# Patient Record
Sex: Female | Born: 1990 | Race: Black or African American | Hispanic: No | Marital: Single | Smoking: Never smoker
Health system: Southern US, Community
[De-identification: ages and names within clinical notes are randomized; demographics above are authoritative.]

## PROBLEM LIST (undated history)

## (undated) ENCOUNTER — Inpatient Hospital Stay (HOSPITAL_COMMUNITY): Payer: Self-pay

## (undated) DIAGNOSIS — R87629 Unspecified abnormal cytological findings in specimens from vagina: Secondary | ICD-10-CM

## (undated) DIAGNOSIS — D649 Anemia, unspecified: Secondary | ICD-10-CM

## (undated) DIAGNOSIS — F329 Major depressive disorder, single episode, unspecified: Secondary | ICD-10-CM

## (undated) DIAGNOSIS — G43909 Migraine, unspecified, not intractable, without status migrainosus: Secondary | ICD-10-CM

## (undated) DIAGNOSIS — F419 Anxiety disorder, unspecified: Secondary | ICD-10-CM

## (undated) DIAGNOSIS — B009 Herpesviral infection, unspecified: Secondary | ICD-10-CM

## (undated) DIAGNOSIS — F32A Depression, unspecified: Secondary | ICD-10-CM

## (undated) DIAGNOSIS — J45909 Unspecified asthma, uncomplicated: Secondary | ICD-10-CM

## (undated) HISTORY — PX: WISDOM TOOTH EXTRACTION: SHX21

## (undated) HISTORY — PX: COLPOSCOPY: SHX161

---
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
## 2010-02-17 ENCOUNTER — Inpatient Hospital Stay (HOSPITAL_COMMUNITY)
Admission: AD | Admit: 2010-02-17 | Discharge: 2010-02-17 | Payer: Self-pay | Source: Home / Self Care | Attending: Obstetrics & Gynecology | Admitting: Obstetrics & Gynecology

## 2010-04-27 LAB — URINE MICROSCOPIC-ADD ON

## 2010-04-27 LAB — URINALYSIS, ROUTINE W REFLEX MICROSCOPIC
Bilirubin Urine: NEGATIVE
Glucose, UA: NEGATIVE mg/dL
Ketones, ur: >80 mg/dL — AB
Leukocytes, UA: NEGATIVE
Nitrite: NEGATIVE
Protein, ur: NEGATIVE mg/dL
Specific Gravity, Urine: >1.03 — ABNORMAL HIGH (ref 1.005–1.030)
Urobilinogen, UA: 0.2 mg/dL (ref 0.0–1.0)
pH: 5.5 (ref 5.0–8.0)

## 2010-04-27 LAB — POCT PREGNANCY, URINE: Preg Test, Ur: NEGATIVE

## 2010-05-24 LAB — COMPREHENSIVE METABOLIC PANEL
ALT: 12 U/L (ref 0–35)
AST: 21 U/L (ref 0–37)
Albumin: 3.6 g/dL (ref 3.5–5.2)
Alkaline Phosphatase: 54 U/L (ref 39–117)
BUN: 13 mg/dL (ref 6–23)
CO2: 22 mEq/L (ref 19–32)
Calcium: 9.2 mg/dL (ref 8.4–10.5)
Chloride: 109 mEq/L (ref 96–112)
Creatinine, Ser: 0.6 mg/dL (ref 0.4–1.2)
GFR calc Af Amer: >60 mL/min (ref >60)
GFR calc non Af Amer: >60 mL/min (ref >60)
Glucose, Bld: 96 mg/dL (ref 70–99)
Potassium: 4.3 mEq/L (ref 3.5–5.1)
Sodium: 138 mEq/L (ref 135–145)
Total Bilirubin: 0.5 mg/dL (ref 0.3–1.2)
Total Protein: 7.1 g/dL (ref 6.0–8.3)

## 2010-05-24 LAB — CBC
HCT: 36.9 % (ref 36.0–46.0)
Hemoglobin: 12.3 g/dL (ref 12.0–15.0)
MCHC: 33.3 g/dL (ref 30.0–36.0)
MCV: 88.7 fL (ref 78.0–100.0)
Platelets: 264 10*3/uL (ref 150–400)
RBC: 4.16 MIL/uL (ref 3.87–5.11)
RDW: 14.7 % (ref 11.5–15.5)
WBC: 11.6 10*3/uL — ABNORMAL HIGH (ref 4.0–10.5)

## 2010-05-24 LAB — URINALYSIS, ROUTINE W REFLEX MICROSCOPIC
Bilirubin Urine: NEGATIVE
Glucose, UA: NEGATIVE mg/dL
Ketones, ur: NEGATIVE mg/dL
Leukocytes, UA: NEGATIVE
Nitrite: NEGATIVE
Protein, ur: NEGATIVE mg/dL
Specific Gravity, Urine: 1.027 (ref 1.005–1.030)
Urobilinogen, UA: 1 mg/dL (ref 0.0–1.0)
pH: 7.5 (ref 5.0–8.0)

## 2010-05-24 LAB — DIFFERENTIAL
Basophils Absolute: 0 10*3/uL (ref 0.0–0.1)
Basophils Relative: 0 % (ref 0–1)
Eosinophils Absolute: 0.7 10*3/uL (ref 0.0–0.7)
Eosinophils Relative: 6 % — ABNORMAL HIGH (ref 0–5)
Lymphocytes Relative: 35 % (ref 12–46)
Lymphs Abs: 4.1 10*3/uL — ABNORMAL HIGH (ref 0.7–4.0)
Monocytes Absolute: 1 10*3/uL (ref 0.1–1.0)
Monocytes Relative: 9 % (ref 3–12)
Neutro Abs: 5.8 10*3/uL (ref 1.7–7.7)
Neutrophils Relative %: 50 % (ref 43–77)

## 2010-05-24 LAB — LIPASE, BLOOD: Lipase: 23 U/L (ref 11–59)

## 2010-05-24 LAB — PREGNANCY, URINE: Preg Test, Ur: NEGATIVE

## 2010-05-24 LAB — URINE MICROSCOPIC-ADD ON

## 2010-11-02 ENCOUNTER — Emergency Department (HOSPITAL_COMMUNITY)
Admission: EM | Admit: 2010-11-02 | Discharge: 2010-11-02 | Disposition: A | Discharge status: Home / Self Care | Payer: BC Managed Care – PPO | Attending: Emergency Medicine | Admitting: Emergency Medicine

## 2010-11-02 ENCOUNTER — Emergency Department (HOSPITAL_COMMUNITY): Payer: BC Managed Care – PPO

## 2010-11-02 DIAGNOSIS — S92009A Unspecified fracture of unspecified calcaneus, initial encounter for closed fracture: Secondary | ICD-10-CM | POA: Insufficient documentation

## 2010-11-02 DIAGNOSIS — S93409A Sprain of unspecified ligament of unspecified ankle, initial encounter: Secondary | ICD-10-CM | POA: Insufficient documentation

## 2010-11-02 DIAGNOSIS — X500XXA Overexertion from strenuous movement or load, initial encounter: Secondary | ICD-10-CM | POA: Insufficient documentation

## 2010-11-09 LAB — URINALYSIS, ROUTINE W REFLEX MICROSCOPIC
Bilirubin Urine: NEGATIVE
Glucose, UA: NEGATIVE
Ketones, ur: NEGATIVE
Nitrite: NEGATIVE
Protein, ur: NEGATIVE
Specific Gravity, Urine: <1.005 — ABNORMAL LOW
Urobilinogen, UA: 0.2
pH: 7

## 2010-11-09 LAB — URINE MICROSCOPIC-ADD ON: RBC / HPF: NONE SEEN

## 2010-11-12 LAB — CBC
HCT: 33.7 — ABNORMAL LOW
HCT: 27.9 — ABNORMAL LOW
Hemoglobin: 11.7 — ABNORMAL LOW
Hemoglobin: 9.8 — ABNORMAL LOW
MCHC: 34.6
MCHC: 35.1
MCV: 95.6
MCV: 94.8
Platelets: 214
Platelets: 172
RBC: 3.53 — ABNORMAL LOW
RBC: 2.94 — ABNORMAL LOW
RDW: 13.8
RDW: 13.9
WBC: 8.1
WBC: 11.1 — ABNORMAL HIGH

## 2010-11-12 LAB — RPR: RPR Ser Ql: NONREACTIVE

## 2011-04-16 ENCOUNTER — Other Ambulatory Visit (HOSPITAL_COMMUNITY)
Admission: RE | Admit: 2011-04-16 | Discharge: 2011-04-16 | Disposition: A | Discharge status: Home / Self Care | Payer: BC Managed Care – PPO | Source: Ambulatory Visit | Attending: Family Medicine | Admitting: Family Medicine

## 2011-04-16 DIAGNOSIS — Z01419 Encounter for gynecological examination (general) (routine) without abnormal findings: Secondary | ICD-10-CM | POA: Insufficient documentation

## 2011-12-11 ENCOUNTER — Inpatient Hospital Stay (HOSPITAL_COMMUNITY)
Admission: AD | Admit: 2011-12-11 | Discharge: 2011-12-11 | Disposition: A | Discharge status: Home / Self Care | Payer: BC Managed Care – PPO | Source: Ambulatory Visit | Attending: Obstetrics and Gynecology | Admitting: Obstetrics and Gynecology

## 2011-12-11 ENCOUNTER — Inpatient Hospital Stay (HOSPITAL_COMMUNITY): Payer: BC Managed Care – PPO

## 2011-12-11 ENCOUNTER — Encounter (HOSPITAL_COMMUNITY): Payer: Self-pay

## 2011-12-11 DIAGNOSIS — O418X90 Other specified disorders of amniotic fluid and membranes, unspecified trimester, not applicable or unspecified: Secondary | ICD-10-CM

## 2011-12-11 DIAGNOSIS — O468X9 Other antepartum hemorrhage, unspecified trimester: Secondary | ICD-10-CM

## 2011-12-11 DIAGNOSIS — O209 Hemorrhage in early pregnancy, unspecified: Secondary | ICD-10-CM

## 2011-12-11 HISTORY — DX: Unspecified asthma, uncomplicated: J45.909

## 2011-12-11 LAB — URINALYSIS, ROUTINE W REFLEX MICROSCOPIC
Glucose, UA: NEGATIVE mg/dL
Ketones, ur: 40 mg/dL — AB
Leukocytes, UA: NEGATIVE
Nitrite: NEGATIVE
Protein, ur: NEGATIVE mg/dL
Specific Gravity, Urine: >1.03 — ABNORMAL HIGH (ref 1.005–1.030)
Urobilinogen, UA: 0.2 mg/dL (ref 0.0–1.0)
pH: 6.5 (ref 5.0–8.0)

## 2011-12-11 LAB — CBC
MCH: 29.8 pg (ref 26.0–34.0)
MCHC: 34.3 g/dL (ref 30.0–36.0)
MCV: 87 fL (ref 78.0–100.0)
Platelets: 358 10*3/uL (ref 150–400)

## 2011-12-11 LAB — ABO/RH: ABO/RH(D): A POS

## 2011-12-11 LAB — POCT PREGNANCY, URINE: Preg Test, Ur: POSITIVE — AB

## 2011-12-11 LAB — URINE MICROSCOPIC-ADD ON

## 2011-12-11 MED ORDER — ONDANSETRON 8 MG PO TBDP: 8.0000 mg | ORAL_TABLET | Freq: Three times a day (TID) | ORAL | Status: DC | PRN

## 2011-12-11 MED ORDER — SODIUM CHLORIDE 0.9 % IV SOLN
25.0000 mg | Freq: Once | INTRAVENOUS | Status: AC
  Administered 2011-12-11: 25 mg via INTRAVENOUS
  Filled 2011-12-11: qty 1

## 2011-12-11 MED ORDER — DOCUSATE SODIUM 100 MG PO CAPS: 100.0000 mg | ORAL_CAPSULE | Freq: Two times a day (BID) | ORAL | Status: DC

## 2011-12-11 NOTE — MAU Provider Note (Signed)
Chief Complaint: Nausea, Emesis, Vaginal Bleeding and Abdominal Cramping   First Provider Initiated Contact with Patient 12/11/11 [phone]     SUBJECTIVE HPI: Michelle Faulkner is a 21 y.o. G2P1001 at unknown gestation who presents with n/v for 2 days, dark brown vaginal bleeding and cramping since last night. Unsure LMP, maybe in September.   Past Medical History  Diagnosis Date  . Asthma    OB History    Grav Para Term Preterm Abortions TAB SAB Ect Mult Living   2 1 1       1      # Outc Date GA Lbr Len/2nd Wgt Sex Del Anes PTL Lv   1 TRM            2 CUR              Past Surgical History  Procedure Date  . Wisdom tooth extraction    History   Social History  . Marital Status: Single    Spouse Name: N/A    Number of Children: N/A  . Years of Education: N/A   Occupational History  . Not on file.   Social History Main Topics  . Smoking status: Current Some Day Smoker    Types: Cigarettes  . Smokeless tobacco: Not on file  . Alcohol Use: 0.6 oz/week    1 Shots of liquor per week  . Drug Use: Yes    Special: Marijuana     everyday   . Sexually Active: Yes    Birth Control/ Protection: None   Other Topics Concern  . Not on file   Social History Narrative  . No narrative on file   No current facility-administered medications on file prior to encounter.   Current Outpatient Prescriptions on File Prior to Encounter  Medication Sig Dispense Refill  . albuterol (PROVENTIL) (5 MG/ML) 0.5% nebulizer solution Take 2.5 mg by nebulization every 6 (six) hours as needed.      . budesonide-formoterol (SYMBICORT) 80-4.5 MCG/ACT inhaler Inhale 2 puffs into the lungs 2 (two) times daily.       No Known Allergies  ROS: Pertinent items in HPI  OBJECTIVE Blood pressure 113/68, pulse 100, temperature 97.1 F (36.2 C), temperature source Oral, resp. rate 18, last menstrual period 11/13/2011. GENERAL: Well-developed, well-nourished female in no acute distress.  HEENT:  Normocephalic HEART: normal rate RESP: normal effort ABDOMEN: Soft, non-tender EXTREMITIES: Nontender, no edema NEURO: Alert and oriented SPECULUM EXAM:  BIMANUAL:   LAB RESULTS Results for orders placed during the hospital encounter of 12/11/11 (from the past 24 hour(s))  URINALYSIS, ROUTINE W REFLEX MICROSCOPIC     Status: Abnormal   Collection Time   12/11/11  5:55 AM      Component Value Range   Color, Urine YELLOW  YELLOW   APPearance CLEAR  CLEAR   Specific Gravity, Urine >1.030 (*) 1.005 - 1.030   pH 6.5  5.0 - 8.0   Glucose, UA NEGATIVE  NEGATIVE mg/dL   Hgb urine dipstick LARGE (*) NEGATIVE   Bilirubin Urine SMALL (*) NEGATIVE   Ketones, ur 40 (*) NEGATIVE mg/dL   Protein, ur NEGATIVE  NEGATIVE mg/dL   Urobilinogen, UA 0.2  0.0 - 1.0 mg/dL   Nitrite NEGATIVE  NEGATIVE   Leukocytes, UA NEGATIVE  NEGATIVE  URINE MICROSCOPIC-ADD ON     Status: Abnormal   Collection Time   12/11/11  5:55 AM      Component Value Range   Squamous Epithelial / LPF FEW (*)  RARE   WBC, UA 0-2  <3 WBC/hpf   RBC / HPF 3-6  <3 RBC/hpf   Bacteria, UA FEW (*) RARE  POCT PREGNANCY, URINE     Status: Abnormal   Collection Time   12/11/11  6:02 AM      Component Value Range   Preg Test, Ur POSITIVE (*) NEGATIVE  ABO/RH     Status: Normal   Collection Time   12/11/11  6:28 AM      Component Value Range   ABO/RH(D) A POS    HCG, QUANTITATIVE, PREGNANCY     Status: Abnormal   Collection Time   12/11/11  6:28 AM      Component Value Range   hCG, Beta Chain, Sharene Butters, [place] [address] (*) <5 mIU/mL  CBC     Status: Abnormal   Collection Time   12/11/11  6:28 AM      Component Value Range   WBC 6.3  4.0 - 10.5 K/uL   RBC 3.92  3.87 - 5.11 MIL/uL   Hemoglobin 11.7 (*) 12.0 - 15.0 g/dL   HCT 60.4 (*) 54.0 - 98.1 %   MCV 87.0  78.0 - 100.0 fL   MCH 29.8  26.0 - 34.0 pg   MCHC 34.3  30.0 - 36.0 g/dL   RDW 19.1  47.8 - 29.5 %   Platelets 358  150 - 400 K/uL    IMAGING US Ob Comp Less 14  Wks  12/11/2011  *RADIOLOGY REPORT*  Clinical Data: First trimester pregnancy.  Spotting.  Pelvic pain.  OBSTETRIC <14 WK [place] AND TRANSVAGINAL OB US  Technique:  Both transabdominal and transvaginal ultrasound examinations were performed for complete evaluation of the gestation as well as the maternal uterus, adnexal regions, and pelvic cul-de-sac.  Transvaginal technique was performed to assess early pregnancy.  Comparison:  None.  Intrauterine gestational sac:  A single gestational sac is present. Yolk sac: Present Embryo: Present Cardiac Activity: Present Heart Rate: 102 bpm  CRL: 2.6  mm  5    w  6 d           [place] EDC: 08/06/2012  Maternal uterus/adnexae: A subchorionic hemorrhage measures 2.6 1.4 x 1.4 cm.  This covers less than one third of the circumference of the gestational sac. No free fluid is present.  A corpus luteal cyst is evident within the right ovary.  The adnexa are otherwise unremarkable.  IMPRESSION:  1.  Single intrauterine pregnancy with an estimated gestational age of [redacted] weeks and 6 days. 2.  Fetal heart rate of 102 beats per minute is within normal limits for age. 3.  Subchorionic hemorrhage covers less than on the third of the circumference of the gestational sac.   Original Report Authenticated By: Jamesetta Orleans. MATTERN, M.D.    US Ob Transvaginal  12/11/2011  *RADIOLOGY REPORT*  Clinical Data: First trimester pregnancy.  Spotting.  Pelvic pain.  OBSTETRIC <14 WK [place] AND TRANSVAGINAL OB US  Technique:  Both transabdominal and transvaginal ultrasound examinations were performed for complete evaluation of the gestation as well as the maternal uterus, adnexal regions, and pelvic cul-de-sac.  Transvaginal technique was performed to assess early pregnancy.  Comparison:  None.  Intrauterine gestational sac:  A single gestational sac is present. Yolk sac: Present Embryo: Present Cardiac Activity: Present Heart Rate: 102 bpm  CRL: 2.6  mm  5    w  6 d           [place] EDC: 08/06/2012  Maternal  uterus/adnexae: A subchorionic hemorrhage measures 2.6 1.4 x 1.4 cm.  This covers less than one third of the circumference of the gestational sac. No free fluid is present.  A corpus luteal cyst is evident within the right ovary.  The adnexa are otherwise unremarkable.  IMPRESSION:  1.  Single intrauterine pregnancy with an estimated gestational age of [redacted] weeks and 6 days. 2.  Fetal heart rate of 102 beats per minute is within normal limits for age. 3.  Subchorionic hemorrhage covers less than on the third of the circumference of the gestational sac.   Original Report Authenticated By: Jamesetta Orleans. MATTERN, M.D.    MAU COURSE Care of patient turned over to Georges Mouse, CNM at 0800. Pt in [place].   Archie Patten, CNM 12/11/2011 3:14 PM   ASSESSMENT 1. Subchorionic hemorrhage   2. Bleeding in early pregnancy     PLAN Discharge home, f/u in office this week, precautions rev'd     Follow-up Information    Call Jessee Avers., MD.   [facility] information:   [address] [place] [address] [phone]           Medication List     As of 12/11/2011  3:14 PM    TAKE these medications         albuterol (5 MG/ML) 0.5% nebulizer solution   Commonly known as: PROVENTIL   Take 2.5 mg by nebulization every 6 (six) hours as needed.      budesonide-formoterol 80-4.5 MCG/ACT inhaler   Commonly known as: SYMBICORT   Inhale 2 puffs into the lungs 2 (two) times daily.      docusate sodium 100 MG capsule   Commonly known as: COLACE   Take 1 capsule (100 mg total) by mouth 2 (two) times daily.      ondansetron 8 MG disintegrating tablet   Commonly known as: ZOFRAN-ODT   Take 1 tablet (8 mg total) by mouth every 8 (eight) hours as needed for nausea.

## 2011-12-11 NOTE — MAU Note (Signed)
Patient is in with c/o n/v for 2 days, dark brown vaginal discharge last night and intermittent cramping. lmp unsure.

## 2011-12-29 ENCOUNTER — Inpatient Hospital Stay (HOSPITAL_COMMUNITY)
Admission: AD | Admit: 2011-12-29 | Discharge: 2011-12-30 | Disposition: A | Discharge status: Home / Self Care | Payer: BC Managed Care – PPO | Source: Ambulatory Visit | Attending: Obstetrics and Gynecology | Admitting: Obstetrics and Gynecology

## 2011-12-29 ENCOUNTER — Encounter (HOSPITAL_COMMUNITY): Payer: Self-pay | Admitting: *Deleted

## 2011-12-29 DIAGNOSIS — O219 Vomiting of pregnancy, unspecified: Secondary | ICD-10-CM

## 2011-12-29 DIAGNOSIS — K5900 Constipation, unspecified: Secondary | ICD-10-CM

## 2011-12-29 DIAGNOSIS — O99891 Other specified diseases and conditions complicating pregnancy: Secondary | ICD-10-CM | POA: Diagnosis not present

## 2011-12-29 DIAGNOSIS — O210 Mild hyperemesis gravidarum: Secondary | ICD-10-CM | POA: Insufficient documentation

## 2011-12-29 LAB — URINALYSIS, ROUTINE W REFLEX MICROSCOPIC
Bilirubin Urine: NEGATIVE
Ketones, ur: 40 mg/dL — AB
Leukocytes, UA: NEGATIVE
Nitrite: NEGATIVE
Protein, ur: NEGATIVE mg/dL

## 2011-12-29 LAB — URINE MICROSCOPIC-ADD ON

## 2011-12-29 MED ORDER — FLEET ENEMA 7-19 GM/118ML RE ENEM: 1.0000 | ENEMA | Freq: Once | RECTAL | Status: DC

## 2011-12-29 MED ORDER — ONDANSETRON 8 MG/NS 50 ML IVPB
8.0000 mg | Freq: Once | INTRAVENOUS | Status: AC
  Administered 2011-12-29: 8 mg via INTRAVENOUS
  Filled 2011-12-29: qty 8

## 2011-12-29 MED ORDER — LACTATED RINGERS IV BOLUS (SEPSIS)
1000.0000 mL | Freq: Once | INTRAVENOUS | Status: AC
  Administered 2011-12-29: 1000 mL via INTRAVENOUS

## 2011-12-29 NOTE — MAU Note (Signed)
Pt G2 P1 at 6.4wks, having nausea vomiting and constipation.  No BM x 2.5wks, tried enema, miralax and colace-no results.

## 2011-12-29 NOTE — MAU Note (Signed)
Pt given soap suds enema. Took 

## 2011-12-29 NOTE — MAU Note (Signed)
Pt states no results from the enema. Thressa Sheller, CNM notified

## 2011-12-29 NOTE — MAU Provider Note (Signed)
  History     CSN: [id number]  Arrival date and time: 12/29/11 2046   First Provider Initiated Contact with Patient 12/29/11 2123      Chief Complaint  Patient presents with  . Morning Sickness  . Emesis During Pregnancy  . Constipation   HPI  Michelle Faulkner is a 21 y.o. G2P1001 she has been vomiting for 2 weeks. Since yesterday she has been unable to keep anything down. She has not had a BM in 2.5 weeks. She has tried an enema, Miralax and Colace. She was seen here about 2 weeks ago and given colace and zofran.   Past Medical History  Diagnosis Date  . Asthma     Past Surgical History  Procedure Date  . Wisdom tooth extraction     History reviewed. No pertinent family history.  History  Substance Use Topics  . Smoking status: Current Some Day Smoker    Types: Cigarettes  . Smokeless tobacco: Not on file  . Alcohol Use: 0.6 oz/week    1 Shots of liquor per week    Allergies: No Known Allergies  Prescriptions prior to admission  Medication Sig Dispense Refill  . albuterol (PROVENTIL) (5 MG/ML) 0.5% nebulizer solution Take 2.5 mg by nebulization every 6 (six) hours as needed.      . budesonide-formoterol (SYMBICORT) 80-4.5 MCG/ACT inhaler Inhale 2 puffs into the lungs 2 (two) times daily.      Marland Kitchen docusate sodium (COLACE) 100 MG capsule Take 1 capsule (100 mg total) by mouth 2 (two) times daily.  30 capsule  2  . ondansetron (ZOFRAN ODT) 8 MG disintegrating tablet Take 1 tablet (8 mg total) by mouth every 8 (eight) hours as needed for nausea.  20 tablet  0    ROS Physical Exam   Blood pressure 100/63, pulse 89, temperature 98.2 F (36.8 C), temperature source Oral, resp. rate 16, height 5\' 3"  (1.6 m), weight 76.295 kg (168 lb 3.2 oz), last menstrual period 11/13/2011.  Physical Exam  Nursing note and vitals reviewed. Constitutional: She is oriented to person, place, and time. She appears well-developed and well-nourished.  Cardiovascular: Normal rate and  regular rhythm.   Respiratory: Effort normal and breath sounds normal.  GI: Soft. Bowel sounds are normal. She exhibits no distension. There is no tenderness. There is no rebound and no guarding.  Neurological: She is alert and oriented to person, place, and time.  Skin: Skin is warm and dry.    MAU Course  Procedures  2255: Pt did not have results with soaps suds enema. RN states that the patient did not hold enema for any length of time. She went to the bathroom immediatly after enema was placed. Will try a smaller volume fleets enema, and ask patient to hold for 15 mins. Discussed option to manually disimpact the patient if no results.   2336: Pt reports good results from Fleets enema. Assessment and Plan   1. Constipation in pregnancy   2. Nausea and vomiting of pregnancy, antepartum   Zofran 8mg  q 8 hours Colace 100mg  PO BID FU with PCP as scheduled.   Tawnya Crook 12/29/2011, 9:24 PM

## 2011-12-29 NOTE — MAU Note (Signed)
Pt presents for nausea, vomiting, and constipation.  No bowel movement x2weeks.  Pt has tried Miralax, Colace, Rx stool softener, and enema with no relief.  Does have known bowel problems, but it has gotten worse over the past two weeks.  Pt was taking Zofran until last week, and has been taking Emetrol x2days.  Denies any abdominal tenderness or cramping.

## 2011-12-30 DIAGNOSIS — O210 Mild hyperemesis gravidarum: Secondary | ICD-10-CM | POA: Diagnosis not present

## 2012-02-16 NOTE — L&D Delivery Note (Signed)
Attestation of Attending Supervision of Advanced Practitioner (CNM/NP): Evaluation and management procedures were performed by the Advanced Practitioner under my supervision and collaboration.  I have reviewed the Advanced Practitioner's note and chart, and I agree with the management and plan.  Michelle Faulkner 08/07/2012 12:14 PM

## 2012-02-16 NOTE — L&D Delivery Note (Signed)
Delivery Note At 2:13 AM a viable and healthy female was delivered via Vaginal, Spontaneous Delivery (Presentation: Left Occiput Anterior).  APGAR: 8, 9; weight small.   Placenta status: Intact, Spontaneous.  Cord: 3 vessels with the following complications: None.    Anesthesia: None  Episiotomy: None Lacerations: None Suture Repair: n/a Est. Blood Loss (mL): 200   Mom to postpartum.  Baby to nursery-stable  Placenta to path due to IUGR.  Wynelle Bourgeois 08/04/2012, 2:30 AM

## 2012-03-20 ENCOUNTER — Inpatient Hospital Stay (HOSPITAL_COMMUNITY)
Admission: AD | Admit: 2012-03-20 | Discharge: 2012-03-20 | Disposition: A | Discharge status: Home / Self Care | Payer: Medicaid Other | Source: Ambulatory Visit | Attending: Obstetrics & Gynecology | Admitting: Obstetrics & Gynecology

## 2012-03-20 ENCOUNTER — Encounter (HOSPITAL_COMMUNITY): Payer: Self-pay

## 2012-03-20 DIAGNOSIS — O99891 Other specified diseases and conditions complicating pregnancy: Secondary | ICD-10-CM | POA: Insufficient documentation

## 2012-03-20 DIAGNOSIS — J45909 Unspecified asthma, uncomplicated: Secondary | ICD-10-CM | POA: Insufficient documentation

## 2012-03-20 DIAGNOSIS — J45901 Unspecified asthma with (acute) exacerbation: Secondary | ICD-10-CM

## 2012-03-20 DIAGNOSIS — Z3490 Encounter for supervision of normal pregnancy, unspecified, unspecified trimester: Secondary | ICD-10-CM

## 2012-03-20 HISTORY — DX: Major depressive disorder, single episode, unspecified: F32.9

## 2012-03-20 HISTORY — DX: Anemia, unspecified: D64.9

## 2012-03-20 HISTORY — DX: Anxiety disorder, unspecified: F41.9

## 2012-03-20 HISTORY — DX: Depression, unspecified: F32.A

## 2012-03-20 LAB — WET PREP, GENITAL
Clue Cells Wet Prep HPF POC: NONE SEEN
Yeast Wet Prep HPF POC: NONE SEEN

## 2012-03-20 MED ORDER — HYDROXYZINE HCL 25 MG PO TABS
25.0000 mg | ORAL_TABLET | ORAL | Status: AC
  Administered 2012-03-20: 25 mg via ORAL
  Filled 2012-03-20: qty 1

## 2012-03-20 MED ORDER — BUDESONIDE-FORMOTEROL FUMARATE 80-4.5 MCG/ACT IN AERO: 2.0000 | INHALATION_SPRAY | Freq: Two times a day (BID) | RESPIRATORY_TRACT | Status: DC

## 2012-03-20 MED ORDER — IPRATROPIUM BROMIDE 0.02 % IN SOLN: 0.5000 mg | Freq: Once | RESPIRATORY_TRACT | Status: DC

## 2012-03-20 MED ORDER — HYDROXYZINE PAMOATE 25 MG PO CAPS: 25.0000 mg | ORAL_CAPSULE | Freq: Three times a day (TID) | ORAL | Status: DC | PRN

## 2012-03-20 MED ORDER — ALBUTEROL SULFATE (5 MG/ML) 0.5% IN NEBU: 2.5000 mg | INHALATION_SOLUTION | Freq: Once | RESPIRATORY_TRACT | Status: DC

## 2012-03-20 NOTE — MAU Note (Signed)
Pt states began having asthma issues, kept vomiting r/t coughing so much. Np pnc thus far. Has used inhaler multiple times since 7am when symptoms began. Pt's lung sounds are clear, no wheezing, states does have hx of anxiety. Notes watery odorous vaginal discharge at times, denies bleeding. Did go to Dr. Dawayne Patricia for prenatal interview shortly after finding out she was pregnant, and after first MAU visit with this pregnancy.

## 2012-03-20 NOTE — MAU Provider Note (Signed)
  History     CSN: [id number]  Arrival date and time: 03/20/12 0745   None     Chief Complaint  Patient presents with  . Asthma   HPI  Michelle Faulkner is a .22 y.o. G2P1001 at [redacted]w[redacted]d who presents today with an asthma attack. She states that around 0650 she started wheezing and feeling SOB. She used her inhaler 16 times from about 0650 until arrival here. She states that now she is feeling very anxious.   Past Medical History  Diagnosis Date  . Asthma     Past Surgical History  Procedure Date  . Wisdom tooth extraction     No family history on file.  History  Substance Use Topics  . Smoking status: Current Some Day Smoker    Types: Cigarettes  . Smokeless tobacco: Not on file  . Alcohol Use: 0.6 oz/week    1 Shots of liquor per week    Allergies: No Known Allergies  Prescriptions prior to admission  Medication Sig Dispense Refill  . albuterol (PROVENTIL) (5 MG/ML) 0.5% nebulizer solution Take 2.5 mg by nebulization every 6 (six) hours as needed.      . budesonide-formoterol (SYMBICORT) 80-4.5 MCG/ACT inhaler Inhale 2 puffs into the lungs 2 (two) times daily.      Marland Kitchen docusate sodium (COLACE) 100 MG capsule Take 1 capsule (100 mg total) by mouth 2 (two) times daily.  30 capsule  2  . ondansetron (ZOFRAN ODT) 8 MG disintegrating tablet Take 1 tablet (8 mg total) by mouth every 8 (eight) hours as needed for nausea.  20 tablet  0    ROS Physical Exam   Blood pressure 133/74, pulse 115, temperature 98.4 F (36.9 C), temperature source Oral, resp. rate 24, height 5\' 3"  (1.6 m), last menstrual period 11/13/2011.  Physical Exam  Respiratory: Effort normal. No respiratory distress. She has no wheezes. She has no rales.    MAU Course  Procedures  0805: RT here to eval patient. In agreement that lungs are clear. Will hold breathing treatment for now, and treat anxiety.   Assessment and Plan  0815 Care turned over to [facility].  Tawnya Crook 03/20/2012, 8:09 AM

## 2012-03-20 NOTE — MAU Provider Note (Signed)
Attestation of Attending Supervision of Advanced Practitioner (PA/CNM/NP): Evaluation and management procedures were performed by the Advanced Practitioner under my supervision and collaboration.  I have reviewed the Advanced Practitioner's note and chart, and I agree with the management and plan.  Toyoko Silos, MD, FACOG Attending Obstetrician & Gynecologist Faculty Practice, [facility]  

## 2012-03-21 LAB — GC/CHLAMYDIA PROBE AMP
CT Probe RNA: NEGATIVE
GC Probe RNA: NEGATIVE

## 2012-03-30 ENCOUNTER — Encounter: Payer: BC Managed Care – PPO | Admitting: Family

## 2012-03-31 ENCOUNTER — Ambulatory Visit (HOSPITAL_COMMUNITY): Payer: Medicaid Other

## 2012-03-31 ENCOUNTER — Ambulatory Visit (HOSPITAL_COMMUNITY): Payer: BC Managed Care – PPO

## 2012-04-05 ENCOUNTER — Encounter (HOSPITAL_COMMUNITY): Payer: Self-pay

## 2012-04-05 ENCOUNTER — Ambulatory Visit (HOSPITAL_COMMUNITY)
Admission: RE | Admit: 2012-04-05 | Discharge: 2012-04-05 | Disposition: A | Discharge status: Home / Self Care | Payer: Medicaid Other | Source: Ambulatory Visit | Attending: Advanced Practice Midwife | Admitting: Advanced Practice Midwife

## 2012-04-05 DIAGNOSIS — Z1389 Encounter for screening for other disorder: Secondary | ICD-10-CM | POA: Insufficient documentation

## 2012-04-05 DIAGNOSIS — O358XX0 Maternal care for other (suspected) fetal abnormality and damage, not applicable or unspecified: Secondary | ICD-10-CM | POA: Insufficient documentation

## 2012-04-05 DIAGNOSIS — Z3490 Encounter for supervision of normal pregnancy, unspecified, unspecified trimester: Secondary | ICD-10-CM

## 2012-04-05 DIAGNOSIS — Z363 Encounter for antenatal screening for malformations: Secondary | ICD-10-CM | POA: Insufficient documentation

## 2012-04-06 ENCOUNTER — Encounter: Payer: Self-pay | Admitting: Advanced Practice Midwife

## 2012-04-06 DIAGNOSIS — J45901 Unspecified asthma with (acute) exacerbation: Secondary | ICD-10-CM | POA: Insufficient documentation

## 2012-04-06 DIAGNOSIS — F410 Panic disorder [episodic paroxysmal anxiety] without agoraphobia: Secondary | ICD-10-CM | POA: Insufficient documentation

## 2012-04-06 DIAGNOSIS — Z3490 Encounter for supervision of normal pregnancy, unspecified, unspecified trimester: Secondary | ICD-10-CM | POA: Insufficient documentation

## 2012-04-06 NOTE — MAU Provider Note (Signed)
  History        CSN: [id number]   Arrival date and time: 03/20/12 0745    None         Chief Complaint   Patient presents with   .  Asthma      HPI   Michelle Faulkner is a 22 y.o. G2P1001 at [redacted]w[redacted]d who presents today with an asthma attack. She states that around 0650 she started wheezing and feeling SOB. She used her inhaler 16 times from about 0650 until arrival here. She states that now she is feeling very anxious.     Pt was on Symbicort prior to pregnancy but stopped this when she found out she was pregnant.  She has used her inhaler several times daily for the last couple of weeks prior to today's incident.  Past Medical History   Diagnosis  Date   .  Asthma           Past Surgical History   Procedure  Date   .  Wisdom tooth extraction          No family history on file.    History   Substance Use Topics   .  Smoking status:  Current Some Day Smoker       Types:  Cigarettes   .  Smokeless tobacco:  Not on file   .  Alcohol Use:  0.6 oz/week       1 Shots of liquor per week        Allergies: No Known Allergies    Prescriptions prior to admission   Medication  Sig  Dispense  Refill   .  albuterol (PROVENTIL) (5 MG/ML) 0.5% nebulizer solution  Take 2.5 mg by nebulization every 6 (six) hours as needed.         .  budesonide-formoterol (SYMBICORT) 80-4.5 MCG/ACT inhaler  Inhale 2 puffs into the lungs 2 (two) times daily.         Marland Kitchen  docusate sodium (COLACE) 100 MG capsule  Take 1 capsule (100 mg total) by mouth 2 (two) times daily.   30 capsule   2   .  ondansetron (ZOFRAN ODT) 8 MG disintegrating tablet  Take 1 tablet (8 mg total) by mouth every 8 (eight) hours as needed for nausea.   20 tablet   0        ROS Negative ROS except items noted in HPI  Physical Exam      Blood pressure 133/74, pulse 115, temperature 98.4 F (36.9 C), temperature source Oral, resp. rate 24, height 5\' 3"  (1.6 m), last menstrual period 11/13/2011.   Physical  Exam  Respiratory: Effort normal. No respiratory distress. She has no wheezes. She has no rales.       MAU Course    Procedures   0805: RT here to eval patient. In agreement that lungs are clear. Will hold breathing treatment for now, and treat anxiety.     Assessment and Plan    0815 Care turned over to [facility].   Michelle Faulkner 03/20/2012, 8:09 AM    A:   1. Normal IUP (intrauterine pregnancy) on prenatal ultrasound   Asthma attack/exacerbation Anxiety attack r/t asthma attack  P: Vistaril 25 mg PO x1 dose in MAU D/C home Symbicort prescription renewed with limited refills Pt to f/u with PCP who managed asthma in the past F/U with Dr Richardson Dopp for prenatal care as planned Return to MAU as needed  Michelle Faulkner Certified Nurse-Midwife

## 2012-04-06 NOTE — MAU Provider Note (Signed)
Attestation of Attending Supervision of Advanced Practitioner (PA/CNM/NP): Evaluation and management procedures were performed by the Advanced Practitioner under my supervision and collaboration.  I have reviewed the Advanced Practitioner's note and chart, and I agree with the management and plan.  Armondo Cech, MD, FACOG Attending Obstetrician & Gynecologist Faculty Practice, [facility]  

## 2012-04-13 ENCOUNTER — Encounter: Payer: Self-pay | Admitting: Obstetrics & Gynecology

## 2012-04-13 ENCOUNTER — Other Ambulatory Visit: Payer: Self-pay | Admitting: Obstetrics & Gynecology

## 2012-04-13 ENCOUNTER — Other Ambulatory Visit (HOSPITAL_COMMUNITY)
Admission: RE | Admit: 2012-04-13 | Discharge: 2012-04-13 | Disposition: A | Discharge status: Home / Self Care | Payer: Medicaid Other | Source: Ambulatory Visit | Attending: Obstetrics & Gynecology | Admitting: Obstetrics & Gynecology

## 2012-04-13 ENCOUNTER — Ambulatory Visit (INDEPENDENT_AMBULATORY_CARE_PROVIDER_SITE_OTHER): Payer: Medicaid Other | Admitting: Obstetrics & Gynecology

## 2012-04-13 VITALS — BP 108/71 | Temp 97.7°F | Wt 166.9 lb

## 2012-04-13 DIAGNOSIS — Z01419 Encounter for gynecological examination (general) (routine) without abnormal findings: Secondary | ICD-10-CM | POA: Insufficient documentation

## 2012-04-13 DIAGNOSIS — Z23 Encounter for immunization: Secondary | ICD-10-CM

## 2012-04-13 DIAGNOSIS — Z3480 Encounter for supervision of other normal pregnancy, unspecified trimester: Secondary | ICD-10-CM

## 2012-04-13 DIAGNOSIS — Z3492 Encounter for supervision of normal pregnancy, unspecified, second trimester: Secondary | ICD-10-CM

## 2012-04-13 DIAGNOSIS — F410 Panic disorder [episodic paroxysmal anxiety] without agoraphobia: Secondary | ICD-10-CM

## 2012-04-13 LAB — POCT URINALYSIS DIP (DEVICE)
Bilirubin Urine: NEGATIVE
Glucose, UA: NEGATIVE mg/dL
Specific Gravity, Urine: 1.02 (ref 1.005–1.030)

## 2012-04-13 MED ORDER — INFLUENZA VIRUS VACC SPLIT PF IM SUSP
0.5000 mL | Freq: Once | INTRAMUSCULAR | Status: AC
  Administered 2012-04-13: 0.5 mL via INTRAMUSCULAR

## 2012-04-13 MED ORDER — CITALOPRAM HYDROBROMIDE 20 MG PO TABS: 20.0000 mg | ORAL_TABLET | Freq: Every day | ORAL | Status: DC

## 2012-04-13 NOTE — Progress Notes (Signed)
Pulse- 82 New ob packet given  Weight gain of 11-20lbs Flu vaccine consented

## 2012-04-13 NOTE — Progress Notes (Signed)
   Subjective:late to care    Michelle Faulkner is a G2P1001 [redacted]w[redacted]d being seen today for her first obstetrical visit.  Her obstetrical history is significant for late prenatal care. Patient does intend to breast feed. Pregnancy history fully reviewed.  Patient reports no complaints.  Filed Vitals:   04/13/12 1005  BP: 108/71  Temp: 97.7 F (36.5 C)  Weight: 166 lb 14.4 oz (75.705 kg)    HISTORY: OB History   Grav Para Term Preterm Abortions TAB SAB Ect Mult Living   2 1 1       1      # Outc Date GA Lbr Len/2nd Wgt Sex Del Anes PTL Lv   1 TRM 6/09 [redacted]w[redacted]d  7lb9oz(3.43kg) M SVD EPI  Yes   2 CUR              Past Medical History  Diagnosis Date  . Asthma   . Anxiety   . Anemia   . Depression    Past Surgical History  Procedure Laterality Date  . Wisdom tooth extraction     Family History  Problem Relation Age of Onset  . Other Neg Hx   . Asthma Mother   . Diabetes Mother   . Anxiety disorder Mother   . Ulcers Sister      Exam    Uterus:     Pelvic Exam:    Perineum: No Hemorrhoids   Vulva: normal   Vagina:  normal mucosa   pH:    Cervix: no lesions   Adnexa: not evaluated   Bony Pelvis: gynecoid  System: Breast:  normal appearance, no masses or tenderness   Skin: normal coloration and turgor, no rashes    Neurologic: oriented, normal mood   Extremities: normal strength, tone, and muscle mass   HEENT oropharynx clear, no lesions and neck supple with midline trachea   Mouth/Teeth dental hygiene good   Neck supple   Cardiovascular: regular rate and rhythm, no murmurs or gallops   Respiratory:  appears well, vitals normal, no respiratory distress, acyanotic, normal RR, neck free of mass or lymphadenopathy, chest clear, no wheezing, crepitations, rhonchi, normal symmetric air entry   Abdomen: soft, non-tender; bowel sounds normal; no masses,  no organomegaly   Urinary: urethral meatus normal      Assessment:    Pregnancy: G2P1001 Patient Active Problem  List  Diagnosis  . Asthma exacerbation, mild  . Anxiety attack  . Supervision of low-risk pregnancy        Plan:  Routine prenatal care   Initial labs drawn. Prenatal vitamins. Problem list reviewed and updated. Genetic Screening too late  Ultrasound discussed; fetal survey: results reviewed.  Follow up in 4 weeks. 50% of 30 min visit spent on counseling and coordination of care.  F/u [facility]   Krystel Fletchall 04/13/2012

## 2012-04-13 NOTE — Patient Instructions (Signed)
Pregnancy - Second Trimester The second trimester of pregnancy (3 to 6 months) is a period of rapid growth for you and your baby. At the end of the sixth month, your baby is about 9 inches long and weighs 1 1/2 pounds. You will begin to feel the baby move between 18 and 20 weeks of the pregnancy. This is called quickening. Weight gain is faster. A clear fluid (colostrum) may leak out of your breasts. You may feel small contractions of the womb (uterus). This is known as false labor or Braxton-Hicks contractions. This is like a practice for labor when the baby is ready to be born. Usually, the problems with morning sickness have usually passed by the end of your first trimester. Some women develop small dark blotches (called cholasma, mask of pregnancy) on their face that usually goes away after the baby is born. Exposure to the sun makes the blotches worse. Acne may also develop in some pregnant women and pregnant women who have acne, may find that it goes away. PRENATAL EXAMS  Blood work may continue to be done during prenatal exams. These tests are done to check on your health and the probable health of your baby. Blood work is used to follow your blood levels (hemoglobin). Anemia (low hemoglobin) is common during pregnancy. Iron and vitamins are given to help prevent this. You will also be checked for diabetes between 24 and 28 weeks of the pregnancy. Some of the previous blood tests may be repeated.  The size of the uterus is measured during each visit. This is to make sure that the baby is continuing to grow properly according to the dates of the pregnancy.  Your blood pressure is checked every prenatal visit. This is to make sure you are not getting toxemia.  Your urine is checked to make sure you do not have an infection, diabetes or protein in the urine.  Your weight is checked often to make sure gains are happening at the suggested rate. This is to ensure that both you and your baby are growing  normally.  Sometimes, an ultrasound is performed to confirm the proper growth and development of the baby. This is a test which bounces harmless sound waves off the baby so your caregiver can more accurately determine due dates. Sometimes, a specialized test is done on the amniotic fluid surrounding the baby. This test is called an amniocentesis. The amniotic fluid is obtained by sticking a needle into the belly (abdomen). This is done to check the chromosomes in instances where there is a concern about possible genetic problems with the baby. It is also sometimes done near the end of pregnancy if an early delivery is required. In this case, it is done to help make sure the baby's lungs are mature enough for the baby to live outside of the womb. CHANGES OCCURING IN THE SECOND TRIMESTER OF PREGNANCY Your body goes through many changes during pregnancy. They vary from person to person. Talk to your caregiver about changes you notice that you are concerned about.  During the second trimester, you will likely have an increase in your appetite. It is normal to have cravings for certain foods. This varies from person to person and pregnancy to pregnancy.  Your lower abdomen will begin to bulge.  You may have to urinate more often because the uterus and baby are pressing on your bladder. It is also common to get more bladder infections during pregnancy (pain with urination). You can help this by   drinking lots of fluids and emptying your bladder before and after intercourse.  You may begin to get stretch marks on your hips, abdomen, and breasts. These are normal changes in the body during pregnancy. There are no exercises or medications to take that prevent this change.  You may begin to develop swollen and bulging veins (varicose veins) in your legs. Wearing support hose, elevating your feet for 15 minutes, 3 to 4 times a day and limiting salt in your diet helps lessen the problem.  Heartburn may develop  as the uterus grows and pushes up against the stomach. Antacids recommended by your caregiver helps with this problem. Also, eating smaller meals 4 to 5 times a day helps.  Constipation can be treated with a stool softener or adding bulk to your diet. Drinking lots of fluids, vegetables, fruits, and whole grains are helpful.  Exercising is also helpful. If you have been very active up until your pregnancy, most of these activities can be continued during your pregnancy. If you have been less active, it is helpful to start an exercise program such as walking.  Hemorrhoids (varicose veins in the rectum) may develop at the end of the second trimester. Warm sitz baths and hemorrhoid cream recommended by your caregiver helps hemorrhoid problems.  Backaches may develop during this time of your pregnancy. Avoid heavy lifting, wear low heal shoes and practice good posture to help with backache problems.  Some pregnant women develop tingling and numbness of their hand and fingers because of swelling and tightening of ligaments in the wrist (carpel tunnel syndrome). This goes away after the baby is born.  As your breasts enlarge, you may have to get a bigger bra. Get a comfortable, cotton, support bra. Do not get a nursing bra until the last month of the pregnancy if you will be nursing the baby.  You may get a dark line from your belly button to the pubic area called the linea nigra.  You may develop rosy cheeks because of increase blood flow to the face.  You may develop spider looking lines of the face, neck, arms and chest. These go away after the baby is born. HOME CARE INSTRUCTIONS   It is extremely important to avoid all smoking, herbs, alcohol, and unprescribed drugs during your pregnancy. These chemicals affect the formation and growth of the baby. Avoid these chemicals throughout the pregnancy to ensure the delivery of a healthy infant.  Most of your home care instructions are the same as  suggested for the first trimester of your pregnancy. Keep your caregiver's appointments. Follow your caregiver's instructions regarding medication use, exercise and diet.  During pregnancy, you are providing food for you and your baby. Continue to eat regular, well-balanced meals. Choose foods such as meat, fish, milk and other low fat dairy products, vegetables, fruits, and whole-grain breads and cereals. Your caregiver will tell you of the ideal weight gain.  A physical sexual relationship may be continued up until near the end of pregnancy if there are no other problems. Problems could include early (premature) leaking of amniotic fluid from the membranes, vaginal bleeding, abdominal pain, or other medical or pregnancy problems.  Exercise regularly if there are no restrictions. Check with your caregiver if you are unsure of the safety of some of your exercises. The greatest weight gain will occur in the last 2 trimesters of pregnancy. Exercise will help you:  Control your weight.  Get you in shape for labor and delivery.  Lose weight   after you have the baby.  Wear a good support or jogging bra for breast tenderness during pregnancy. This may help if worn during sleep. Pads or tissues may be used in the bra if you are leaking colostrum.  Do not use hot tubs, steam rooms or saunas throughout the pregnancy.  Wear your seat belt at all times when driving. This protects you and your baby if you are in an accident.  Avoid raw meat, uncooked cheese, cat litter boxes and soil used by cats. These carry germs that can cause birth defects in the baby.  The second trimester is also a good time to visit your dentist for your dental health if this has not been done yet. Getting your teeth cleaned is OK. Use a soft toothbrush. Brush gently during pregnancy.  It is easier to loose urine during pregnancy. Tightening up and strengthening the pelvic muscles will help with this problem. Practice stopping your  urination while you are going to the bathroom. These are the same muscles you need to strengthen. It is also the muscles you would use as if you were trying to stop from passing gas. You can practice tightening these muscles up 10 times a set and repeating this about 3 times per day. Once you know what muscles to tighten up, do not perform these exercises during urination. It is more likely to contribute to an infection by backing up the urine.  Ask for help if you have financial, counseling or nutritional needs during pregnancy. Your caregiver will be able to offer counseling for these needs as well as refer you for other special needs.  Your skin may become oily. If so, wash your face with mild soap, use non-greasy moisturizer and oil or cream based makeup. MEDICATIONS AND DRUG USE IN PREGNANCY  Take prenatal vitamins as directed. The vitamin should contain 1 milligram of folic acid. Keep all vitamins out of reach of children. Only a couple vitamins or tablets containing iron may be fatal to a baby or young child when ingested.  Avoid use of all medications, including herbs, over-the-counter medications, not prescribed or suggested by your caregiver. Only take over-the-counter or prescription medicines for pain, discomfort, or fever as directed by your caregiver. Do not use aspirin.  Let your caregiver also know about herbs you may be using.  Alcohol is related to a number of birth defects. This includes fetal alcohol syndrome. All alcohol, in any form, should be avoided completely. Smoking will cause low birth rate and premature babies.  Street or illegal drugs are very harmful to the baby. They are absolutely forbidden. A baby born to an addicted mother will be addicted at birth. The baby will go through the same withdrawal an adult does. SEEK MEDICAL CARE IF:  You have any concerns or worries during your pregnancy. It is better to call with your questions if you feel they cannot wait, rather  than worry about them. SEEK IMMEDIATE MEDICAL CARE IF:   An unexplained oral temperature above 102 F (38.9 C) develops, or as your caregiver suggests.  You have leaking of fluid from the vagina (birth canal). If leaking membranes are suspected, take your temperature and tell your caregiver of this when you call.  There is vaginal spotting, bleeding, or passing clots. Tell your caregiver of the amount and how many pads are used. Light spotting in pregnancy is common, especially following intercourse.  You develop a bad smelling vaginal discharge with a change in the color from clear   to white.  You continue to feel sick to your stomach (nauseated) and have no relief from remedies suggested. You vomit blood or coffee ground-like materials.  You lose more than 2 pounds of weight or gain more than 2 pounds of weight over 1 week, or as suggested by your caregiver.  You notice swelling of your face, hands, feet, or legs.  You get exposed to German measles and have never had them.  You are exposed to fifth disease or chickenpox.  You develop belly (abdominal) pain. Round ligament discomfort is a common non-cancerous (benign) cause of abdominal pain in pregnancy. Your caregiver still must evaluate you.  You develop a bad headache that does not go away.  You develop fever, diarrhea, pain with urination, or shortness of breath.  You develop visual problems, blurry, or double vision.  You fall or are in a car accident or any kind of trauma.  There is mental or physical violence at home. Document Released: 01/26/2001 Document Revised: 04/26/2011 Document Reviewed: 07/31/2008 [employer] Patient Information 2013 [employer].  

## 2012-04-14 ENCOUNTER — Encounter (HOSPITAL_COMMUNITY): Payer: Self-pay | Admitting: *Deleted

## 2012-04-14 ENCOUNTER — Inpatient Hospital Stay (HOSPITAL_COMMUNITY)
Admission: AD | Admit: 2012-04-14 | Discharge: 2012-04-14 | Disposition: A | Discharge status: Home / Self Care | Payer: Medicaid Other | Source: Ambulatory Visit | Attending: Obstetrics and Gynecology | Admitting: Obstetrics and Gynecology

## 2012-04-14 DIAGNOSIS — O219 Vomiting of pregnancy, unspecified: Secondary | ICD-10-CM

## 2012-04-14 DIAGNOSIS — O212 Late vomiting of pregnancy: Secondary | ICD-10-CM | POA: Insufficient documentation

## 2012-04-14 LAB — URINALYSIS, ROUTINE W REFLEX MICROSCOPIC
Glucose, UA: NEGATIVE mg/dL
Specific Gravity, Urine: 1.01 (ref 1.005–1.030)
pH: 6 (ref 5.0–8.0)

## 2012-04-14 LAB — OBSTETRIC PANEL
Basophils Absolute: 0 10*3/uL (ref 0.0–0.1)
Eosinophils Absolute: 0.2 10*3/uL (ref 0.0–0.7)
Eosinophils Relative: 2 % (ref 0–5)
Lymphocytes Relative: 27 % (ref 12–46)
MCH: 30.2 pg (ref 26.0–34.0)
MCV: 90.1 fL (ref 78.0–100.0)
Neutrophils Relative %: 65 % (ref 43–77)
Platelets: 313 10*3/uL (ref 150–400)
RDW: 14.3 % (ref 11.5–15.5)
WBC: 6.5 10*3/uL (ref 4.0–10.5)

## 2012-04-14 LAB — URINE MICROSCOPIC-ADD ON

## 2012-04-14 MED ORDER — CYCLOBENZAPRINE HCL 10 MG PO TABS: 10.0000 mg | ORAL_TABLET | Freq: Once | ORAL | Status: DC

## 2012-04-14 MED ORDER — ONDANSETRON HCL 4 MG/2ML IJ SOLN
4.0000 mg | Freq: Once | INTRAMUSCULAR | Status: AC
  Administered 2012-04-14: 4 mg via INTRAVENOUS
  Filled 2012-04-14: qty 2

## 2012-04-14 MED ORDER — PROMETHAZINE HCL 25 MG PO TABS: 25.0000 mg | ORAL_TABLET | Freq: Four times a day (QID) | ORAL | Status: DC | PRN

## 2012-04-14 MED ORDER — PROMETHAZINE HCL 25 MG/ML IJ SOLN
25.0000 mg | Freq: Once | INTRAMUSCULAR | Status: AC
  Administered 2012-04-14: 25 mg via INTRAVENOUS
  Filled 2012-04-14: qty 1

## 2012-04-14 MED ORDER — ONDANSETRON 8 MG PO TBDP: 8.0000 mg | ORAL_TABLET | Freq: Three times a day (TID) | ORAL | Status: DC | PRN

## 2012-04-14 MED ORDER — DEXTROSE 5 % IN LACTATED RINGERS IV BOLUS
1000.0000 mL | Freq: Once | INTRAVENOUS | Status: AC
  Administered 2012-04-14: 1000 mL via INTRAVENOUS

## 2012-04-14 NOTE — MAU Note (Signed)
N/V not able to keep anything down, thinks it is from anxiety, started anxiety back yesterday.

## 2012-04-14 NOTE — MAU Note (Signed)
States was seen in [facility] yesterday. Discussed anxiety and was advised to go back on previous meds. Just started taking again yesterday, but is too nauseated to keep down. Was not prescribed anything for nausea/vomiting.

## 2012-04-14 NOTE — MAU Provider Note (Signed)
Care assumed from Pamelia Hoit, NP, please see her MAU note. Pt here for n/v. > 80 ketones on UA, normal specific gravity. Pt has received IV hydration with D5LR, Zofran 4 mg IV and Phenergan 25 mg IV. Feeling better now.   A/P: 22 y.o. G2P1001 at [redacted]w[redacted]d with n/v  Rx Zofran 8 mg ODT and Phenergan 25 mg tab Follow up in clinic as scheduled or sooner PRN  1. Nausea and vomiting in pregnancy       Medication List    TAKE these medications       albuterol 108 (90 BASE) MCG/ACT inhaler  Commonly known as:  PROVENTIL HFA;VENTOLIN HFA  Inhale 2 puffs into the lungs every 6 (six) hours as needed. For shortness of breath     citalopram 20 MG tablet  Commonly known as:  CELEXA  Take 1 tablet (20 mg total) by mouth daily.     ondansetron 8 MG disintegrating tablet  Commonly known as:  ZOFRAN ODT  Take 1 tablet (8 mg total) by mouth every 8 (eight) hours as needed for nausea.     prenatal multivitamin Tabs  Take 1 tablet by mouth daily.     promethazine 25 MG tablet  Commonly known as:  PHENERGAN  Take 1 tablet (25 mg total) by mouth every 6 (six) hours as needed for nausea.            Follow-up Information   Follow up with [facility]. (as scheduled)    Contact information:   [address] [place] [address] [phone]

## 2012-04-14 NOTE — MAU Provider Note (Signed)
History     CSN: [id number]  Arrival date and time: 04/14/12 1430   First Provider Initiated Contact with Patient 04/14/12 1659      Chief Complaint  Patient presents with  . Nausea  . Emesis During Pregnancy   HPI  Pt is [redacted]w[redacted]d pregnant and presents with nausea and vomiting.  She has been feeling "bad" for 5 days.  She started vomiting on Monday, 4 days ago.  Pt was seen in the clinic yesterday and was told to start back Celexa, which she started yesterday and was able to keep down.  She has vomiting any time she eats or drinks.  She denies diarrhea.  She is usually constipated but has had normal bowel movements yesterday. Her sister has had nausea and vomiting, stomach virus and co-worker's son has a stomach virus.  Pt denies spotting or bleeding.    Past Medical History  Diagnosis Date  . Asthma   . Anxiety   . Anemia   . Depression     Past Surgical History  Procedure Laterality Date  . Wisdom tooth extraction      Family History  Problem Relation Age of Onset  . Other Neg Hx   . Asthma Mother   . Diabetes Mother   . Anxiety disorder Mother   . Ulcers Sister     History  Substance Use Topics  . Smoking status: Passive Smoke Exposure - Never Smoker    Types: Cigarettes  . Smokeless tobacco: Never Used  . Alcohol Use: No    Allergies: No Known Allergies  Prescriptions prior to admission  Medication Sig Dispense Refill  . acetaminophen (TYLENOL) 500 MG tablet Take 1,000 mg by mouth daily as needed. For headache      . albuterol (PROVENTIL HFA;VENTOLIN HFA) 108 (90 BASE) MCG/ACT inhaler Inhale 2 puffs into the lungs every 6 (six) hours as needed. For shortness of breath      . budesonide-formoterol (SYMBICORT) 80-4.5 MCG/ACT inhaler Inhale 2 puffs into the lungs 2 (two) times daily.  1 Inhaler  1  . citalopram (CELEXA) 20 MG tablet Take 1 tablet (20 mg total) by mouth daily.  30 tablet  12  . hydrOXYzine (VISTARIL) 25 MG capsule Take 1 capsule (25 mg total) by  mouth 3 (three) times daily as needed for anxiety.  30 capsule  0  . Prenatal Vit-Fe Fumarate-FA (PRENATAL MULTIVITAMIN) TABS Take 1 tablet by mouth daily.        Review of Systems  Constitutional: Negative for fever and chills.  Gastrointestinal: Positive for nausea and vomiting. Negative for abdominal pain, diarrhea and constipation.  Genitourinary: Negative for dysuria and urgency.  Neurological: Negative for headaches.  Psychiatric/Behavioral: The patient is nervous/anxious.    Physical Exam   Blood pressure 109/70, pulse 76, temperature 97.9 F (36.6 C), temperature source Oral, resp. rate 18, height 5\' 3"  (1.6 m), weight 166 lb 9.6 oz (75.569 kg), last menstrual period 11/13/2011.  Physical Exam  Nursing note and vitals reviewed. Constitutional: She is oriented to person, place, and time. She appears well-developed and well-nourished. No distress.  HENT:  Head: Normocephalic.  Eyes: Pupils are equal, round, and reactive to light.  Neck: Normal range of motion. Neck supple.  Cardiovascular: Normal rate.   Respiratory: Effort normal.  GI: Soft. Bowel sounds are normal. She exhibits no distension. There is no tenderness. There is no rebound and no guarding.  Musculoskeletal: Normal range of motion.  Neurological: She is alert and oriented to person, place,  and time.  Skin: Skin is warm and dry.  Psychiatric: She has a normal mood and affect.    MAU Course  Procedures Results for orders placed during the hospital encounter of 04/14/12 (from the past 72 hour(s))  URINALYSIS, ROUTINE W REFLEX MICROSCOPIC     Status: Abnormal   Collection Time    04/14/12  3:45 PM      Result Value Range   Color, Urine YELLOW  YELLOW   APPearance CLEAR  CLEAR   Specific Gravity, Urine 1.010  1.005 - 1.030   pH 6.0  5.0 - 8.0   Glucose, UA NEGATIVE  NEGATIVE mg/dL   Hgb urine dipstick SMALL (*) NEGATIVE   Bilirubin Urine NEGATIVE  NEGATIVE   Ketones, ur >80 (*) NEGATIVE mg/dL   Protein,  ur NEGATIVE  NEGATIVE mg/dL   Urobilinogen, UA 0.2  0.0 - 1.0 mg/dL   Nitrite NEGATIVE  NEGATIVE   Leukocytes, UA NEGATIVE  NEGATIVE  URINE MICROSCOPIC-ADD ON     Status: Abnormal   Collection Time    04/14/12  3:45 PM      Result Value Range   Squamous Epithelial / LPF FEW (*) RARE   WBC, UA 0-2  <3 WBC/hpf   RBC / HPF 0-2  <3 RBC/hpf   Bacteria, UA FEW (*) RARE   IVF with D5LR antiemetics Zofran 4mg  and phenergan 25 mg IV Assessment and Plan  Hyperemesis with dehydration Care turned over to [facility], CNM  Leroy Trim 04/14/2012, 5:00 PM

## 2012-04-15 LAB — CULTURE, OB URINE
Colony Count: NO GROWTH
Organism ID, Bacteria: NO GROWTH

## 2012-04-17 LAB — HEMOGLOBINOPATHY EVALUATION
Hemoglobin Other: 0 %
Hgb A2 Quant: 2.5 % (ref 2.2–3.2)

## 2012-04-19 NOTE — MAU Provider Note (Signed)
Attestation of Attending Supervision of Advanced Practitioner (CNM/NP): Evaluation and management procedures were performed by the Advanced Practitioner under my supervision and collaboration.  I have reviewed the Advanced Practitioner's note and chart, and I agree with the management and plan.  Lorrane Mccay 04/19/2012 9:08 AM   

## 2012-04-19 NOTE — MAU Provider Note (Signed)
Attestation of Attending Supervision of Advanced Practitioner (CNM/NP): Evaluation and management procedures were performed by the Advanced Practitioner under my supervision and collaboration.  I have reviewed the Advanced Practitioner's note and chart, and I agree with the management and plan.  Mallorey Odonell 04/19/2012 9:08 AM

## 2012-04-21 ENCOUNTER — Telehealth: Payer: Self-pay | Admitting: Obstetrics and Gynecology

## 2012-04-21 NOTE — Telephone Encounter (Addendum)
Message copied by Toula Moos on Fri Apr 21, 2012  8:55 AM ------      Message from: Adam Phenix      Created: Thu Apr 20, 2012  1:42 PM       LSIL, repeat pap 12 month ------ Called patient an notified of above pap result and to repeat pap in 12 months. Patient agrees and satisfied.

## 2012-05-10 ENCOUNTER — Inpatient Hospital Stay (HOSPITAL_COMMUNITY)
Admission: AD | Admit: 2012-05-10 | Discharge: 2012-05-10 | Disposition: A | Discharge status: Home / Self Care | Payer: No Typology Code available for payment source | Source: Ambulatory Visit | Attending: Obstetrics & Gynecology | Admitting: Obstetrics & Gynecology

## 2012-05-10 ENCOUNTER — Ambulatory Visit (INDEPENDENT_AMBULATORY_CARE_PROVIDER_SITE_OTHER): Payer: Medicaid Other | Admitting: Advanced Practice Midwife

## 2012-05-10 ENCOUNTER — Encounter (HOSPITAL_COMMUNITY): Payer: Self-pay | Admitting: *Deleted

## 2012-05-10 VITALS — BP 98/64 | Temp 98.9°F | Wt 166.7 lb

## 2012-05-10 DIAGNOSIS — Z3492 Encounter for supervision of normal pregnancy, unspecified, second trimester: Secondary | ICD-10-CM

## 2012-05-10 DIAGNOSIS — Z23 Encounter for immunization: Secondary | ICD-10-CM

## 2012-05-10 DIAGNOSIS — O99891 Other specified diseases and conditions complicating pregnancy: Secondary | ICD-10-CM | POA: Insufficient documentation

## 2012-05-10 DIAGNOSIS — Y92410 Unspecified street and highway as the place of occurrence of the external cause: Secondary | ICD-10-CM | POA: Insufficient documentation

## 2012-05-10 DIAGNOSIS — R109 Unspecified abdominal pain: Secondary | ICD-10-CM | POA: Insufficient documentation

## 2012-05-10 DIAGNOSIS — Z3480 Encounter for supervision of other normal pregnancy, unspecified trimester: Secondary | ICD-10-CM

## 2012-05-10 DIAGNOSIS — J45901 Unspecified asthma with (acute) exacerbation: Secondary | ICD-10-CM

## 2012-05-10 LAB — POCT URINALYSIS DIP (DEVICE)
Glucose, UA: NEGATIVE mg/dL
Nitrite: NEGATIVE
Urobilinogen, UA: 0.2 mg/dL (ref 0.0–1.0)

## 2012-05-10 LAB — CBC
MCV: 85.8 fL (ref 78.0–100.0)
Platelets: 289 10*3/uL (ref 150–400)
RDW: 14 % (ref 11.5–15.5)
WBC: 6.1 10*3/uL (ref 4.0–10.5)

## 2012-05-10 MED ORDER — CYCLOBENZAPRINE HCL 10 MG PO TABS
5.0000 mg | ORAL_TABLET | Freq: Once | ORAL | Status: AC
  Administered 2012-05-10: 5 mg via ORAL
  Filled 2012-05-10: qty 1

## 2012-05-10 MED ORDER — TETANUS-DIPHTH-ACELL PERTUSSIS 5-2.5-18.5 LF-MCG/0.5 IM SUSP
0.5000 mL | Freq: Once | INTRAMUSCULAR | Status: AC
  Administered 2012-05-10: 0.5 mL via INTRAMUSCULAR

## 2012-05-10 NOTE — Patient Instructions (Signed)

## 2012-05-10 NOTE — Progress Notes (Signed)
P=73,  
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
## 2012-05-10 NOTE — Addendum Note (Signed)
Addended by: Faythe Casa on: 05/10/2012 01:43 PM   Modules accepted: Orders

## 2012-05-10 NOTE — Progress Notes (Signed)
Seen also by me.  Agree with note. 

## 2012-05-10 NOTE — Progress Notes (Signed)
[redacted]w[redacted]d. Doing well. No complaints/concerns.  No contractions, fluid, bleeding. Plans on breastfeeding. Wants circumcision, understands it's not covered by medicaid. Tdap, GTT, CBC, RPR, HIV. Reviewed labor signs.
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
## 2012-05-23 ENCOUNTER — Encounter: Payer: Self-pay | Admitting: *Deleted

## 2012-05-24 ENCOUNTER — Ambulatory Visit (INDEPENDENT_AMBULATORY_CARE_PROVIDER_SITE_OTHER): Payer: Medicaid Other | Admitting: Advanced Practice Midwife

## 2012-05-24 ENCOUNTER — Other Ambulatory Visit: Payer: Self-pay | Admitting: Family Medicine

## 2012-05-24 VITALS — BP 102/66 | Temp 97.4°F | Wt 170.6 lb

## 2012-05-24 DIAGNOSIS — Z3493 Encounter for supervision of normal pregnancy, unspecified, third trimester: Secondary | ICD-10-CM

## 2012-05-24 DIAGNOSIS — Z3480 Encounter for supervision of other normal pregnancy, unspecified trimester: Secondary | ICD-10-CM

## 2012-05-24 LAB — POCT URINALYSIS DIP (DEVICE)
Glucose, UA: NEGATIVE mg/dL
Nitrite: NEGATIVE
Urobilinogen, UA: 0.2 mg/dL (ref 0.0–1.0)
pH: 7.5 (ref 5.0–8.0)

## 2012-05-24 MED ORDER — RANITIDINE HCL 150 MG PO TABS: 150.0000 mg | ORAL_TABLET | Freq: Two times a day (BID) | ORAL | Status: DC

## 2012-05-24 NOTE — Patient Instructions (Addendum)
Pregnancy - Third Trimester  The third trimester of pregnancy (the last 3 months) is a period of the most rapid growth for you and your baby. The baby approaches a length of 20 inches and a weight of 6 to 10 pounds. The baby is adding on fat and getting ready for life outside your body. While inside, babies have periods of sleeping and waking, suck their thumbs, and hiccups. You can often feel small contractions of the uterus. This is false labor. It is also called Braxton-Hicks contractions. This is like a practice for labor. The usual problems in this stage of pregnancy include more difficulty breathing, swelling of the hands and feet from water retention, and having to urinate more often because of the uterus and baby pressing on your bladder.   PRENATAL EXAMS  · Blood work may continue to be done during prenatal exams. These tests are done to check on your health and the probable health of your baby. Blood work is used to follow your blood levels (hemoglobin). Anemia (low hemoglobin) is common during pregnancy. Iron and vitamins are given to help prevent this. You may also continue to be checked for diabetes. Some of the past blood tests may be done again.  · The size of the uterus is measured during each visit. This makes sure your baby is growing properly according to your pregnancy dates.  · Your blood pressure is checked every prenatal visit. This is to make sure you are not getting toxemia.  · Your urine is checked every prenatal visit for infection, diabetes and protein.  · Your weight is checked at each visit. This is done to make sure gains are happening at the suggested rate and that you and your baby are growing normally.  · Sometimes, an ultrasound is performed to confirm the position and the proper growth and development of the baby. This is a test done that bounces harmless sound waves off the baby so your caregiver can more accurately determine due dates.  · Discuss the type of pain medication and  anesthesia you will have during your labor and delivery.  · Discuss the possibility and anesthesia if a Cesarean Section might be necessary.  · Inform your caregiver if there is any mental or physical violence at home.  Sometimes, a specialized non-stress test, contraction stress test and biophysical profile are done to make sure the baby is not having a problem. Checking the amniotic fluid surrounding the baby is called an amniocentesis. The amniotic fluid is removed by sticking a needle into the belly (abdomen). This is sometimes done near the end of pregnancy if an early delivery is required. In this case, it is done to help make sure the baby's lungs are mature enough for the baby to live outside of the womb. If the lungs are not mature and it is unsafe to deliver the baby, an injection of cortisone medication is given to the mother 1 to 2 days before the delivery. This helps the baby's lungs mature and makes it safer to deliver the baby.  CHANGES OCCURING IN THE THIRD TRIMESTER OF PREGNANCY  Your body goes through many changes during pregnancy. They vary from person to person. Talk to your caregiver about changes you notice and are concerned about.  · During the last trimester, you have probably had an increase in your appetite. It is normal to have cravings for certain foods. This varies from person to person and pregnancy to pregnancy.  · You may begin to   get stretch marks on your hips, abdomen, and breasts. These are normal changes in the body during pregnancy. There are no exercises or medications to take which prevent this change.  · Constipation may be treated with a stool softener or adding bulk to your diet. Drinking lots of fluids, fiber in vegetables, fruits, and whole grains are helpful.  · Exercising is also helpful. If you have been very active up until your pregnancy, most of these activities can be continued during your pregnancy. If you have been less active, it is helpful to start an exercise  program such as walking. Consult your caregiver before starting exercise programs.  · Avoid all smoking, alcohol, un-prescribed drugs, herbs and "street drugs" during your pregnancy. These chemicals affect the formation and growth of the baby. Avoid chemicals throughout the pregnancy to ensure the delivery of a healthy infant.  · Backache, varicose veins and hemorrhoids may develop or get worse.  · You will tire more easily in the third trimester, which is normal.  · The baby's movements may be stronger and more often.  · You may become short of breath easily.  · Your belly button may stick out.  · A yellow discharge may leak from your breasts called colostrum.  · You may have a bloody mucus discharge. This usually occurs a few days to a week before labor begins.  HOME CARE INSTRUCTIONS   · Keep your caregiver's appointments. Follow your caregiver's instructions regarding medication use, exercise, and diet.  · During pregnancy, you are providing food for you and your baby. Continue to eat regular, well-balanced meals. Choose foods such as meat, fish, milk and other low fat dairy products, vegetables, fruits, and whole-grain breads and cereals. Your caregiver will tell you of the ideal weight gain.  · A physical sexual relationship may be continued throughout pregnancy if there are no other problems such as early (premature) leaking of amniotic fluid from the membranes, vaginal bleeding, or belly (abdominal) pain.  · Exercise regularly if there are no restrictions. Check with your caregiver if you are unsure of the safety of your exercises. Greater weight gain will occur in the last 2 trimesters of pregnancy. Exercising helps:  · Control your weight.  · Get you in shape for labor and delivery.  · You lose weight after you deliver.  · Rest a lot with legs elevated, or as needed for leg cramps or low back pain.  · Wear a good support or jogging bra for breast tenderness during pregnancy. This may help if worn during  sleep. Pads or tissues may be used in the bra if you are leaking colostrum.  · Do not use hot tubs, steam rooms, or saunas.  · Wear your seat belt when driving. This protects you and your baby if you are in an accident.  · Avoid raw meat, cat litter boxes and soil used by cats. These carry germs that can cause birth defects in the baby.  · It is easier to loose urine during pregnancy. Tightening up and strengthening the pelvic muscles will help with this problem. You can practice stopping your urination while you are going to the bathroom. These are the same muscles you need to strengthen. It is also the muscles you would use if you were trying to stop from passing gas. You can practice tightening these muscles up 10 times a set and repeating this about 3 times per day. Once you know what muscles to tighten up, do not perform these   exercises during urination. It is more likely to cause an infection by backing up the urine.  · Ask for help if you have financial, counseling or nutritional needs during pregnancy. Your caregiver will be able to offer counseling for these needs as well as refer you for other special needs.  · Make a list of emergency phone numbers and have them available.  · Plan on getting help from family or friends when you go home from the hospital.  · Make a trial run to the hospital.  · Take prenatal classes with the father to understand, practice and ask questions about the labor and delivery.  · Prepare the baby's room/nursery.  · Do not travel out of the city unless it is absolutely necessary and with the advice of your caregiver.  · Wear only low or no heal shoes to have better balance and prevent falling.  MEDICATIONS AND DRUG USE IN PREGNANCY  · Take prenatal vitamins as directed. The vitamin should contain 1 milligram of folic acid. Keep all vitamins out of reach of children. Only a couple vitamins or tablets containing iron may be fatal to a baby or young child when ingested.  · Avoid use  of all medications, including herbs, over-the-counter medications, not prescribed or suggested by your caregiver. Only take over-the-counter or prescription medicines for pain, discomfort, or fever as directed by your caregiver. Do not use aspirin, ibuprofen (Motrin®, Advil®, Nuprin®) or naproxen (Aleve®) unless OK'd by your caregiver.  · Let your caregiver also know about herbs you may be using.  · Alcohol is related to a number of birth defects. This includes fetal alcohol syndrome. All alcohol, in any form, should be avoided completely. Smoking will cause low birth rate and premature babies.  · Street/illegal drugs are very harmful to the baby. They are absolutely forbidden. A baby born to an addicted mother will be addicted at birth. The baby will go through the same withdrawal an adult does.  SEEK MEDICAL CARE IF:  You have any concerns or worries during your pregnancy. It is better to call with your questions if you feel they cannot wait, rather than worry about them.  DECISIONS ABOUT CIRCUMCISION  You may or may not know the sex of your baby. If you know your baby is a boy, it may be time to think about circumcision. Circumcision is the removal of the foreskin of the penis. This is the skin that covers the sensitive end of the penis. There is no proven medical need for this. Often this decision is made on what is popular at the time or based upon religious beliefs and social issues. You can discuss these issues with your caregiver or pediatrician.  SEEK IMMEDIATE MEDICAL CARE IF:   · An unexplained oral temperature above 102° F (38.9° C) develops, or as your caregiver suggests.  · You have leaking of fluid from the vagina (birth canal). If leaking membranes are suspected, take your temperature and tell your caregiver of this when you call.  · There is vaginal spotting, bleeding or passing clots. Tell your caregiver of the amount and how many pads are used.  · You develop a bad smelling vaginal discharge with  a change in the color from clear to white.  · You develop vomiting that lasts more than 24 hours.  · You develop chills or fever.  · You develop shortness of breath.  · You develop burning on urination.  · You loose more than 2 pounds of weight   or gain more than 2 pounds of weight or as suggested by your caregiver.  · You notice sudden swelling of your face, hands, and feet or legs.  · You develop belly (abdominal) pain. Round ligament discomfort is a common non-cancerous (benign) cause of abdominal pain in pregnancy. Your caregiver still must evaluate you.  · You develop a severe headache that does not go away.  · You develop visual problems, blurred or double vision.  · If you have not felt your baby move for more than 1 hour. If you think the baby is not moving as much as usual, eat something with sugar in it and lie down on your left side for an hour. The baby should move at least 4 to 5 times per hour. Call right away if your baby moves less than that.  · You fall, are in a car accident or any kind of trauma.  · There is mental or physical violence at home.  Document Released: 01/26/2001 Document Revised: 04/26/2011 Document Reviewed: 07/31/2008  [employer]® Patient Information ©2013 [employer].

## 2012-05-24 NOTE — Progress Notes (Signed)
Well, no c/o except heartburn/reflux - ranitidine prescribed. Rev'd preterm labor precautions and kick counts. 1 hour GCT normal.

## 2012-05-24 NOTE — Progress Notes (Signed)
P = 87 

## 2012-06-07 ENCOUNTER — Ambulatory Visit (INDEPENDENT_AMBULATORY_CARE_PROVIDER_SITE_OTHER): Payer: Medicaid Other | Admitting: Obstetrics and Gynecology

## 2012-06-07 ENCOUNTER — Encounter: Payer: Self-pay | Admitting: Obstetrics and Gynecology

## 2012-06-07 VITALS — BP 100/70 | Temp 98.7°F | Wt 169.7 lb

## 2012-06-07 DIAGNOSIS — Z3480 Encounter for supervision of other normal pregnancy, unspecified trimester: Secondary | ICD-10-CM

## 2012-06-07 DIAGNOSIS — Z3493 Encounter for supervision of normal pregnancy, unspecified, third trimester: Secondary | ICD-10-CM

## 2012-06-07 LAB — POCT URINALYSIS DIP (DEVICE)
Bilirubin Urine: NEGATIVE
Leukocytes, UA: NEGATIVE
Nitrite: NEGATIVE
Protein, ur: NEGATIVE mg/dL
Urobilinogen, UA: 0.2 mg/dL (ref 0.0–1.0)
pH: 7 (ref 5.0–8.0)

## 2012-06-07 NOTE — Progress Notes (Signed)
Anxiety sx/ panic sx  controlled on Celexa. N/V resolved. Lab results reviewed.

## 2012-06-07 NOTE — Patient Instructions (Signed)
Pregnancy - Third Trimester  The third trimester of pregnancy (the last 3 months) is a period of the most rapid growth for you and your baby. The baby approaches a length of 20 inches and a weight of 6 to 10 pounds. The baby is adding on fat and getting ready for life outside your body. While inside, babies have periods of sleeping and waking, suck their thumbs, and hiccups. You can often feel small contractions of the uterus. This is false labor. It is also called Braxton-Hicks contractions. This is like a practice for labor. The usual problems in this stage of pregnancy include more difficulty breathing, swelling of the hands and feet from water retention, and having to urinate more often because of the uterus and baby pressing on your bladder.   PRENATAL EXAMS  · Blood work may continue to be done during prenatal exams. These tests are done to check on your health and the probable health of your baby. Blood work is used to follow your blood levels (hemoglobin). Anemia (low hemoglobin) is common during pregnancy. Iron and vitamins are given to help prevent this. You may also continue to be checked for diabetes. Some of the past blood tests may be done again.  · The size of the uterus is measured during each visit. This makes sure your baby is growing properly according to your pregnancy dates.  · Your blood pressure is checked every prenatal visit. This is to make sure you are not getting toxemia.  · Your urine is checked every prenatal visit for infection, diabetes and protein.  · Your weight is checked at each visit. This is done to make sure gains are happening at the suggested rate and that you and your baby are growing normally.  · Sometimes, an ultrasound is performed to confirm the position and the proper growth and development of the baby. This is a test done that bounces harmless sound waves off the baby so your caregiver can more accurately determine due dates.  · Discuss the type of pain medication and  anesthesia you will have during your labor and delivery.  · Discuss the possibility and anesthesia if a Cesarean Section might be necessary.  · Inform your caregiver if there is any mental or physical violence at home.  Sometimes, a specialized non-stress test, contraction stress test and biophysical profile are done to make sure the baby is not having a problem. Checking the amniotic fluid surrounding the baby is called an amniocentesis. The amniotic fluid is removed by sticking a needle into the belly (abdomen). This is sometimes done near the end of pregnancy if an early delivery is required. In this case, it is done to help make sure the baby's lungs are mature enough for the baby to live outside of the womb. If the lungs are not mature and it is unsafe to deliver the baby, an injection of cortisone medication is given to the mother 1 to 2 days before the delivery. This helps the baby's lungs mature and makes it safer to deliver the baby.  CHANGES OCCURING IN THE THIRD TRIMESTER OF PREGNANCY  Your body goes through many changes during pregnancy. They vary from person to person. Talk to your caregiver about changes you notice and are concerned about.  · During the last trimester, you have probably had an increase in your appetite. It is normal to have cravings for certain foods. This varies from person to person and pregnancy to pregnancy.  · You may begin to   get stretch marks on your hips, abdomen, and breasts. These are normal changes in the body during pregnancy. There are no exercises or medications to take which prevent this change.  · Constipation may be treated with a stool softener or adding bulk to your diet. Drinking lots of fluids, fiber in vegetables, fruits, and whole grains are helpful.  · Exercising is also helpful. If you have been very active up until your pregnancy, most of these activities can be continued during your pregnancy. If you have been less active, it is helpful to start an exercise  program such as walking. Consult your caregiver before starting exercise programs.  · Avoid all smoking, alcohol, un-prescribed drugs, herbs and "street drugs" during your pregnancy. These chemicals affect the formation and growth of the baby. Avoid chemicals throughout the pregnancy to ensure the delivery of a healthy infant.  · Backache, varicose veins and hemorrhoids may develop or get worse.  · You will tire more easily in the third trimester, which is normal.  · The baby's movements may be stronger and more often.  · You may become short of breath easily.  · Your belly button may stick out.  · A yellow discharge may leak from your breasts called colostrum.  · You may have a bloody mucus discharge. This usually occurs a few days to a week before labor begins.  HOME CARE INSTRUCTIONS   · Keep your caregiver's appointments. Follow your caregiver's instructions regarding medication use, exercise, and diet.  · During pregnancy, you are providing food for you and your baby. Continue to eat regular, well-balanced meals. Choose foods such as meat, fish, milk and other low fat dairy products, vegetables, fruits, and whole-grain breads and cereals. Your caregiver will tell you of the ideal weight gain.  · A physical sexual relationship may be continued throughout pregnancy if there are no other problems such as early (premature) leaking of amniotic fluid from the membranes, vaginal bleeding, or belly (abdominal) pain.  · Exercise regularly if there are no restrictions. Check with your caregiver if you are unsure of the safety of your exercises. Greater weight gain will occur in the last 2 trimesters of pregnancy. Exercising helps:  · Control your weight.  · Get you in shape for labor and delivery.  · You lose weight after you deliver.  · Rest a lot with legs elevated, or as needed for leg cramps or low back pain.  · Wear a good support or jogging bra for breast tenderness during pregnancy. This may help if worn during  sleep. Pads or tissues may be used in the bra if you are leaking colostrum.  · Do not use hot tubs, steam rooms, or saunas.  · Wear your seat belt when driving. This protects you and your baby if you are in an accident.  · Avoid raw meat, cat litter boxes and soil used by cats. These carry germs that can cause birth defects in the baby.  · It is easier to loose urine during pregnancy. Tightening up and strengthening the pelvic muscles will help with this problem. You can practice stopping your urination while you are going to the bathroom. These are the same muscles you need to strengthen. It is also the muscles you would use if you were trying to stop from passing gas. You can practice tightening these muscles up 10 times a set and repeating this about 3 times per day. Once you know what muscles to tighten up, do not perform these   exercises during urination. It is more likely to cause an infection by backing up the urine.  · Ask for help if you have financial, counseling or nutritional needs during pregnancy. Your caregiver will be able to offer counseling for these needs as well as refer you for other special needs.  · Make a list of emergency phone numbers and have them available.  · Plan on getting help from family or friends when you go home from the hospital.  · Make a trial run to the hospital.  · Take prenatal classes with the father to understand, practice and ask questions about the labor and delivery.  · Prepare the baby's room/nursery.  · Do not travel out of the city unless it is absolutely necessary and with the advice of your caregiver.  · Wear only low or no heal shoes to have better balance and prevent falling.  MEDICATIONS AND DRUG USE IN PREGNANCY  · Take prenatal vitamins as directed. The vitamin should contain 1 milligram of folic acid. Keep all vitamins out of reach of children. Only a couple vitamins or tablets containing iron may be fatal to a baby or young child when ingested.  · Avoid use  of all medications, including herbs, over-the-counter medications, not prescribed or suggested by your caregiver. Only take over-the-counter or prescription medicines for pain, discomfort, or fever as directed by your caregiver. Do not use aspirin, ibuprofen (Motrin®, Advil®, Nuprin®) or naproxen (Aleve®) unless OK'd by your caregiver.  · Let your caregiver also know about herbs you may be using.  · Alcohol is related to a number of birth defects. This includes fetal alcohol syndrome. All alcohol, in any form, should be avoided completely. Smoking will cause low birth rate and premature babies.  · Street/illegal drugs are very harmful to the baby. They are absolutely forbidden. A baby born to an addicted mother will be addicted at birth. The baby will go through the same withdrawal an adult does.  SEEK MEDICAL CARE IF:  You have any concerns or worries during your pregnancy. It is better to call with your questions if you feel they cannot wait, rather than worry about them.  DECISIONS ABOUT CIRCUMCISION  You may or may not know the sex of your baby. If you know your baby is a boy, it may be time to think about circumcision. Circumcision is the removal of the foreskin of the penis. This is the skin that covers the sensitive end of the penis. There is no proven medical need for this. Often this decision is made on what is popular at the time or based upon religious beliefs and social issues. You can discuss these issues with your caregiver or pediatrician.  SEEK IMMEDIATE MEDICAL CARE IF:   · An unexplained oral temperature above 102° F (38.9° C) develops, or as your caregiver suggests.  · You have leaking of fluid from the vagina (birth canal). If leaking membranes are suspected, take your temperature and tell your caregiver of this when you call.  · There is vaginal spotting, bleeding or passing clots. Tell your caregiver of the amount and how many pads are used.  · You develop a bad smelling vaginal discharge with  a change in the color from clear to white.  · You develop vomiting that lasts more than 24 hours.  · You develop chills or fever.  · You develop shortness of breath.  · You develop burning on urination.  · You loose more than 2 pounds of weight   or gain more than 2 pounds of weight or as suggested by your caregiver.  · You notice sudden swelling of your face, hands, and feet or legs.  · You develop belly (abdominal) pain. Round ligament discomfort is a common non-cancerous (benign) cause of abdominal pain in pregnancy. Your caregiver still must evaluate you.  · You develop a severe headache that does not go away.  · You develop visual problems, blurred or double vision.  · If you have not felt your baby move for more than 1 hour. If you think the baby is not moving as much as usual, eat something with sugar in it and lie down on your left side for an hour. The baby should move at least 4 to 5 times per hour. Call right away if your baby moves less than that.  · You fall, are in a car accident or any kind of trauma.  · There is mental or physical violence at home.  Document Released: 01/26/2001 Document Revised: 04/26/2011 Document Reviewed: 07/31/2008  [employer]® Patient Information ©2013 [employer].

## 2012-06-07 NOTE — Progress Notes (Signed)
P=92, c/o edema in feet , states has some tightening or contractions that resolve with fluids and rest,

## 2012-06-21 ENCOUNTER — Ambulatory Visit (INDEPENDENT_AMBULATORY_CARE_PROVIDER_SITE_OTHER): Payer: Medicaid Other | Admitting: Advanced Practice Midwife

## 2012-06-21 ENCOUNTER — Other Ambulatory Visit: Payer: Self-pay | Admitting: Obstetrics & Gynecology

## 2012-06-21 VITALS — BP 111/75 | Temp 97.4°F | Wt 171.5 lb

## 2012-06-21 DIAGNOSIS — Z113 Encounter for screening for infections with a predominantly sexual mode of transmission: Secondary | ICD-10-CM

## 2012-06-21 DIAGNOSIS — Z3493 Encounter for supervision of normal pregnancy, unspecified, third trimester: Secondary | ICD-10-CM

## 2012-06-21 DIAGNOSIS — Z3480 Encounter for supervision of other normal pregnancy, unspecified trimester: Secondary | ICD-10-CM

## 2012-06-21 LAB — POCT URINALYSIS DIP (DEVICE)
Protein, ur: NEGATIVE mg/dL
Specific Gravity, Urine: 1.025 (ref 1.005–1.030)
Urobilinogen, UA: 0.2 mg/dL (ref 0.0–1.0)
pH: 7 (ref 5.0–8.0)

## 2012-06-21 NOTE — Patient Instructions (Addendum)
Pregnancy - Third Trimester The third trimester of pregnancy (the last 3 months) is a period of the most rapid growth for you and your baby. The baby approaches a length of 20 inches and a weight of 6 to 10 pounds. The baby is adding on fat and getting ready for life outside your body. While inside, babies have periods of sleeping and waking, suck their thumbs, and hiccups. You can often feel small contractions of the uterus. This is false labor. It is also called Braxton-Hicks contractions. This is like a practice for labor. The usual problems in this stage of pregnancy include more difficulty breathing, swelling of the hands and feet from water retention, and having to urinate more often because of the uterus and baby pressing on your bladder.  PRENATAL EXAMS  Blood work may continue to be done during prenatal exams. These tests are done to check on your health and the probable health of your baby. Blood work is used to follow your blood levels (hemoglobin). Anemia (low hemoglobin) is common during pregnancy. Iron and vitamins are given to help prevent this. You may also continue to be checked for diabetes. Some of the past blood tests may be done again.  The size of the uterus is measured during each visit. This makes sure your baby is growing properly according to your pregnancy dates.  Your blood pressure is checked every prenatal visit. This is to make sure you are not getting toxemia.  Your urine is checked every prenatal visit for infection, diabetes and protein.  Your weight is checked at each visit. This is done to make sure gains are happening at the suggested rate and that you and your baby are growing normally.  Sometimes, an ultrasound is performed to confirm the position and the proper growth and development of the baby. This is a test done that bounces harmless sound waves off the baby so your caregiver can more accurately determine due dates.  Discuss the type of pain medication and  anesthesia you will have during your labor and delivery.  Discuss the possibility and anesthesia if a Cesarean Section might be necessary.  Inform your caregiver if there is any mental or physical violence at home. Sometimes, a specialized non-stress test, contraction stress test and biophysical profile are done to make sure the baby is not having a problem. Checking the amniotic fluid surrounding the baby is called an amniocentesis. The amniotic fluid is removed by sticking a needle into the belly (abdomen). This is sometimes done near the end of pregnancy if an early delivery is required. In this case, it is done to help make sure the baby's lungs are mature enough for the baby to live outside of the womb. If the lungs are not mature and it is unsafe to deliver the baby, an injection of cortisone medication is given to the mother 1 to 2 days before the delivery. This helps the baby's lungs mature and makes it safer to deliver the baby. CHANGES OCCURING IN THE THIRD TRIMESTER OF PREGNANCY Your body goes through many changes during pregnancy. They vary from person to person. Talk to your caregiver about changes you notice and are concerned about.  During the last trimester, you have probably had an increase in your appetite. It is normal to have cravings for certain foods. This varies from person to person and pregnancy to pregnancy.  You may begin to get stretch marks on your hips, abdomen, and breasts. These are normal changes in the body  during pregnancy. There are no exercises or medications to take which prevent this change.  Constipation may be treated with a stool softener or adding bulk to your diet. Drinking lots of fluids, fiber in vegetables, fruits, and whole grains are helpful.  Exercising is also helpful. If you have been very active up until your pregnancy, most of these activities can be continued during your pregnancy. If you have been less active, it is helpful to start an exercise  program such as walking. Consult your caregiver before starting exercise programs.  Avoid all smoking, alcohol, un-prescribed drugs, herbs and "street drugs" during your pregnancy. These chemicals affect the formation and growth of the baby. Avoid chemicals throughout the pregnancy to ensure the delivery of a healthy infant.  Backache, varicose veins and hemorrhoids may develop or get worse.  You will tire more easily in the third trimester, which is normal.  The baby's movements may be stronger and more often.  You may become short of breath easily.  Your belly button may stick out.  A yellow discharge may leak from your breasts called colostrum.  You may have a bloody mucus discharge. This usually occurs a few days to a week before labor begins. HOME CARE INSTRUCTIONS   Keep your caregiver's appointments. Follow your caregiver's instructions regarding medication use, exercise, and diet.  During pregnancy, you are providing food for you and your baby. Continue to eat regular, well-balanced meals. Choose foods such as meat, fish, milk and other low fat dairy products, vegetables, fruits, and whole-grain breads and cereals. Your caregiver will tell you of the ideal weight gain.  A physical sexual relationship may be continued throughout pregnancy if there are no other problems such as early (premature) leaking of amniotic fluid from the membranes, vaginal bleeding, or belly (abdominal) pain.  Exercise regularly if there are no restrictions. Check with your caregiver if you are unsure of the safety of your exercises. Greater weight gain will occur in the last 2 trimesters of pregnancy. Exercising helps:  Control your weight.  Get you in shape for labor and delivery.  You lose weight after you deliver.  Rest a lot with legs elevated, or as needed for leg cramps or low back pain.  Wear a good support or jogging bra for breast tenderness during pregnancy. This may help if worn during  sleep. Pads or tissues may be used in the bra if you are leaking colostrum.  Do not use hot tubs, steam rooms, or saunas.  Wear your seat belt when driving. This protects you and your baby if you are in an accident.  Avoid raw meat, cat litter boxes and soil used by cats. These carry germs that can cause birth defects in the baby.  It is easier to loose urine during pregnancy. Tightening up and strengthening the pelvic muscles will help with this problem. You can practice stopping your urination while you are going to the bathroom. These are the same muscles you need to strengthen. It is also the muscles you would use if you were trying to stop from passing gas. You can practice tightening these muscles up 10 times a set and repeating this about 3 times per day. Once you know what muscles to tighten up, do not perform these exercises during urination. It is more likely to cause an infection by backing up the urine.  Ask for help if you have financial, counseling or nutritional needs during pregnancy. Your caregiver will be able to offer counseling for these  needs as well as refer you for other special needs.  Make a list of emergency phone numbers and have them available.  Plan on getting help from family or friends when you go home from the hospital.  Make a trial run to the hospital.  Take prenatal classes with the father to understand, practice and ask questions about the labor and delivery.  Prepare the baby's room/nursery.  Do not travel out of the city unless it is absolutely necessary and with the advice of your caregiver.  Wear only low or no heal shoes to have better balance and prevent falling. MEDICATIONS AND DRUG USE IN PREGNANCY  Take prenatal vitamins as directed. The vitamin should contain 1 milligram of folic acid. Keep all vitamins out of reach of children. Only a couple vitamins or tablets containing iron may be fatal to a baby or young child when ingested.  Avoid use  of all medications, including herbs, over-the-counter medications, not prescribed or suggested by your caregiver. Only take over-the-counter or prescription medicines for pain, discomfort, or fever as directed by your caregiver. Do not use aspirin, ibuprofen (Motrin, Advil, Nuprin) or naproxen (Aleve) unless OK'd by your caregiver.  Let your caregiver also know about herbs you may be using.  Alcohol is related to a number of birth defects. This includes fetal alcohol syndrome. All alcohol, in any form, should be avoided completely. Smoking will cause low birth rate and premature babies.  Street/illegal drugs are very harmful to the baby. They are absolutely forbidden. A baby born to an addicted mother will be addicted at birth. The baby will go through the same withdrawal an adult does. SEEK MEDICAL CARE IF: You have any concerns or worries during your pregnancy. It is better to call with your questions if you feel they cannot wait, rather than worry about them. DECISIONS ABOUT CIRCUMCISION You may or may not know the sex of your baby. If you know your baby is a boy, it may be time to think about circumcision. Circumcision is the removal of the foreskin of the penis. This is the skin that covers the sensitive end of the penis. There is no proven medical need for this. Often this decision is made on what is popular at the time or based upon religious beliefs and social issues. You can discuss these issues with your caregiver or pediatrician. SEEK IMMEDIATE MEDICAL CARE IF:   An unexplained oral temperature above 102 F (38.9 C) develops, or as your caregiver suggests.  You have leaking of fluid from the vagina (birth canal). If leaking membranes are suspected, take your temperature and tell your caregiver of this when you call.  There is vaginal spotting, bleeding or passing clots. Tell your caregiver of the amount and how many pads are used.  You develop a bad smelling vaginal discharge with  a change in the color from clear to white.  You develop vomiting that lasts more than 24 hours.  You develop chills or fever.  You develop shortness of breath.  You develop burning on urination.  You loose more than 2 pounds of weight or gain more than 2 pounds of weight or as suggested by your caregiver.  You notice sudden swelling of your face, hands, and feet or legs.  You develop belly (abdominal) pain. Round ligament discomfort is a common non-cancerous (benign) cause of abdominal pain in pregnancy. Your caregiver still must evaluate you.  You develop a severe headache that does not go away.  You develop visual  problems, blurred or double vision.  If you have not felt your baby move for more than 1 hour. If you think the baby is not moving as much as usual, eat something with sugar in it and lie down on your left side for an hour. The baby should move at least 4 to 5 times per hour. Call right away if your baby moves less than that.  You fall, are in a car accident or any kind of trauma.  There is mental or physical violence at home. Document Released: 01/26/2001 Document Revised: 04/26/2011 Document Reviewed: 07/31/2008 [facility] Patient Information 2013 [place], [place].  Pregnancy and Medications Most of the time, medicine a pregnant woman is taking does not enter the fetus, but sometimes it can. This may cause damage or birth defects. The risk of damage being done to a fetus is the greatest in the first few weeks of pregnancy. This is when major organs are developing. If you are taking any prescription, over-the-counter or herbal medicines, it is best to talk to your caregiver about the medications you are taking before getting pregnant. If you become pregnant, stop taking over-the-counter and herbal medications right away. Tell your caregiver what you were taking. Also, tell him/her medications, if any, you took before knowing you were pregnant. Never take any drugs during  pregnancy unless your caregiver gives you permission. Other things like caffeine, vitamins, herbal teas and remedies can affect the growing fetus. Talk to your caregiver about cutting down on caffeine. Also, ask what type of vitamins you need to take. Never use any herbal product without talking to your caregiver first.  MEDICINES THAT ARE NOT SAFE TO TAKE DURING PREGNANCY   Category A - drugs that have been tested for safety during pregnancy and have been found to be safe. This includes drugs such as:  Folic acid.  Vitamin B6.  Thyroid medicine in moderation or in prescribed doses.  Category B - drugs that have been used a lot during pregnancy and do not appear to cause major birth defects or other problems. This includes drugs such as:  Some antibiotics.  Acetaminophen (Tylenol).  Aspartame (artificial sweetener).  Famotidine (Pepcid).  Prednisone (cortisone).  Insulin (for diabetes).  Ibuprofen (Advil, Motrin) before the third trimester. Pregnant women should not take ibuprofen during the last three months of pregnancy.  Category C - drugs that are more likely to cause problems for the mother or fetus. It also includes drugs for which safety studies have not been finished. The majority of these drugs do not have safety studies in progress. These drugs often come with a warning that they should be used only if the benefits of taking them outweigh the risks. This is something a woman would need to carefully discuss with her caregiver. These drugs include:  Prochlorperzaine (Compazine).  Sudafed.  Fluconazole (Diflucan).  Ciprofloxacin (Cipro).  Some antidepressants are also included in this group.  Category D - drugs that have clear health risks for the fetus. They include:  Alcohol.  Lithium (used to treat manic depression).  Phenytoin (Dilantin).  Most chemotherapy drugs to treat cancer. In some cases, chemotherapy drugs are given during  pregnancy.  Category X - drugs that have been shown to cause birth defects. They should never be taken during pregnancy. These include:  Drugs to treat skin conditions like cystic acne (Accutane) and psoriasis (Tegison or Soriatane).  Thalidomide (a sedative).  Diethylstilbestrol or DES. No medication is considered 100% safe to take when pregnant because everyone  reacts to drugs differently. Aspirin and other drugs containing salicylate are not recommended during pregnancy, especially during the last three months because it can cause bleeding. In rare cases, a woman's caregiver may want her to use these types of drugs under close watch. Acetylsalicylate, a common ingredient in many over-the-counter painkillers, may make a pregnancy last longer. It may cause severe bleeding before and after delivery.  SHOULD I AVOID TAKING ANY MEDICINE WHILE I AM PREGNANT?  Whether or not you should continue taking medicine during pregnancy is a serious question. However, if you stop taking medicine that you need, this could harm both you and your baby. Talk to your caregiver about if the benefits outweigh the risk for you and your baby.  Pregnant and nursing women who need medication for psychiatric conditions should consult with their obstetrician, pediatrician and mental health care provider before taking any medication. NATURAL MEDICATIONS OR HERBAL REMEDIES WHEN YOU ARE PREGNANT While some herbal remedies claim they will help with pregnancy, there are no studies to prove these claims are true. Likewise, there are very few studies to look at how safe and effective herbal remedies are during pregnancy. Do not take any herbal products without talking to your caregiver first. These products may contain agents that could harm you and the growing fetus. They could cause problems with your pregnancy.  If you think or know that your mother took diethylstilbesterol (DES), a factory made estrogen, when she was pregnant  with you, talk with your caregiver right away. Ask her or him about:  What types of tests you may need.  How often they need to be done.  Anything else you may need to do to make sure you do not develop any problems. A woman whose mother was given DES when pregnant should be followed and screened for abnormalities of her female organs all through her life. OVER-THE-COUNTER MEDICATIONS THAT ARE SAFE TO TAKE DURING PREGNANCY: Any medication taken during pregnancy should be taken only with the permission of your caregiver.  Allergy (Benadryl).  Cold and Flu, Tylenol (acetaminophen). Tylenol Cold, warm salt water gargles, saline nasal drops.  Constipation (Metamucil, Citrucil, Fiberall/Fobricon, Colace, Milk of Magnesia , Senekot).  Diarrhea (Kaopectate, Immodium, Parepectolin). You should not take these in the first trimester and only take the medication for 24 hours. Call your caregiver if you still have the diarrhea.  Headache (Tylenol).  Ointment for cuts and scrapes (J & J, Bacitracin, Neosporin).  Heartburn (Tums, Riopan, titralac , Gaviscon).  Hemorrhoids (Preparation H, anusol, tucks, Witch hazel).  Nausea and vomiting (Vitamin B6 100mg  tablets, emetrol if you are not diabetic, Emetrex, sea bands).  Rashes (hydrocortisone cream or ointment, caladryl lotion or cream, benadryl cream or capsules, oatmeal bath).  Yeast infection of the vagina (Monistat cream or tablets, Terazol cream). FOR MORE INFORMATION For more information regarding the medication you are taking and how it affects your pregnancy, go to [employer] Drug Reference link: http://www.drugs.com/drug_information.html *Some information based on studies from The Food and Drug Administration (FDA). Document Released: 02/01/2005 Document Revised: 04/26/2011 Document Reviewed: 10/16/2008 [facility] Patient Information 2013 [place], [place].

## 2012-06-21 NOTE — Progress Notes (Signed)
Patient c/o pain in her throat and ears when swallowing.  Has gargled with salt water.  Patient desires full STD testing.

## 2012-06-21 NOTE — Progress Notes (Signed)
C/O sore throat and ears x 5 days, strep test was negative at Urgent Care, didn't tell her any meds she can take because she's pregnant. Recommend Claritin and saline nasal spray, f/u if sx worsening or last > 10 days. No fetal movement this morning, but active fetus with accels auscultated during exam today. Rev'd kick counts. Pt requests "full STD testing" today - states she wants RPR, GC/CT - done per her request.

## 2012-07-05 ENCOUNTER — Encounter: Payer: Self-pay | Admitting: Obstetrics & Gynecology

## 2012-07-05 ENCOUNTER — Ambulatory Visit (INDEPENDENT_AMBULATORY_CARE_PROVIDER_SITE_OTHER): Payer: Medicaid Other | Admitting: Obstetrics & Gynecology

## 2012-07-05 VITALS — BP 106/75 | Temp 98.0°F | Wt 175.7 lb

## 2012-07-05 DIAGNOSIS — R300 Dysuria: Secondary | ICD-10-CM

## 2012-07-05 DIAGNOSIS — Z3493 Encounter for supervision of normal pregnancy, unspecified, third trimester: Secondary | ICD-10-CM

## 2012-07-05 DIAGNOSIS — O99340 Other mental disorders complicating pregnancy, unspecified trimester: Secondary | ICD-10-CM

## 2012-07-05 LAB — POCT URINALYSIS DIP (DEVICE)
Nitrite: NEGATIVE
Urobilinogen, UA: 0.2 mg/dL (ref 0.0–1.0)
pH: 6.5 (ref 5.0–8.0)

## 2012-07-05 NOTE — Progress Notes (Signed)
Routine OB visit. No OB problems. Complains of ? UTI symptoms. No CVAT. Good FM. Labor precautions reviewed. Kick counts encouraged. Cervical cultures at next visit.

## 2012-07-05 NOTE — Progress Notes (Signed)
Pulse: 76

## 2012-07-05 NOTE — Addendum Note (Signed)
Addended by: Franchot Mimes on: 07/05/2012 08:57 AM   Modules accepted: Orders

## 2012-07-07 LAB — CULTURE, OB URINE
Colony Count: NO GROWTH
Organism ID, Bacteria: NO GROWTH

## 2012-07-11 ENCOUNTER — Encounter: Payer: Self-pay | Admitting: *Deleted

## 2012-07-11 DIAGNOSIS — O99340 Other mental disorders complicating pregnancy, unspecified trimester: Secondary | ICD-10-CM | POA: Insufficient documentation

## 2012-07-12 ENCOUNTER — Ambulatory Visit (INDEPENDENT_AMBULATORY_CARE_PROVIDER_SITE_OTHER): Payer: Medicaid Other | Admitting: Family Medicine

## 2012-07-12 VITALS — BP 103/78 | Temp 98.1°F | Wt 176.1 lb

## 2012-07-12 DIAGNOSIS — O26843 Uterine size-date discrepancy, third trimester: Secondary | ICD-10-CM

## 2012-07-12 DIAGNOSIS — Z3493 Encounter for supervision of normal pregnancy, unspecified, third trimester: Secondary | ICD-10-CM

## 2012-07-12 DIAGNOSIS — O26849 Uterine size-date discrepancy, unspecified trimester: Secondary | ICD-10-CM

## 2012-07-12 LAB — POCT URINALYSIS DIP (DEVICE)
Bilirubin Urine: NEGATIVE
Glucose, UA: NEGATIVE mg/dL
Nitrite: NEGATIVE
Urobilinogen, UA: 0.2 mg/dL (ref 0.0–1.0)

## 2012-07-12 NOTE — Patient Instructions (Signed)
Pregnancy - Third Trimester The third trimester of pregnancy (the last 3 months) is a period of the most rapid growth for you and your baby. The baby approaches a length of 20 inches and a weight of 6 to 10 pounds. The baby is adding on fat and getting ready for life outside your body. While inside, babies have periods of sleeping and waking, sucking thumbs, and hiccuping. You can often feel small contractions of the uterus. This is false labor. It is also called Braxton-Hicks contractions. This is like a practice for labor. The usual problems in this stage of pregnancy include more difficulty breathing, swelling of the hands and feet from water retention, and having to urinate more often because of the uterus and baby pressing on your bladder.  PRENATAL EXAMS  Blood work may continue to be done during prenatal exams. These tests are done to check on your health and the probable health of your baby. Blood work is used to follow your blood levels (hemoglobin). Anemia (low hemoglobin) is common during pregnancy. Iron and vitamins are given to help prevent this. You may also continue to be checked for diabetes. Some of the past blood tests may be done again.  The size of the uterus is measured during each visit. This makes sure your baby is growing properly according to your pregnancy dates.  Your blood pressure is checked every prenatal visit. This is to make sure you are not getting toxemia.  Your urine is checked every prenatal visit for infection, diabetes, and protein.  Your weight is checked at each visit. This is done to make sure gains are happening at the suggested rate and that you and your baby are growing normally.  Sometimes, an ultrasound is performed to confirm the position and the proper growth and development of the baby. This is a test done that bounces harmless sound waves off the baby so your caregiver can more accurately determine a due date.  Discuss the type of pain medicine and  anesthesia you will have during your labor and delivery.  Discuss the possibility and anesthesia if a cesarean section might be necessary.  Inform your caregiver if there is any mental or physical violence at home. Sometimes, a specialized non-stress test, contraction stress test, and biophysical profile are done to make sure the baby is not having a problem. Checking the amniotic fluid surrounding the baby is called an amniocentesis. The amniotic fluid is removed by sticking a needle into the belly (abdomen). This is sometimes done near the end of pregnancy if an early delivery is required. In this case, it is done to help make sure the baby's lungs are mature enough for the baby to live outside of the womb. If the lungs are not mature and it is unsafe to deliver the baby, an injection of cortisone medicine is given to the mother 1 to 2 days before the delivery. This helps the baby's lungs mature and makes it safer to deliver the baby. CHANGES OCCURING IN THE THIRD TRIMESTER OF PREGNANCY Your body goes through many changes during pregnancy. They vary from person to person. Talk to your caregiver about changes you notice and are concerned about.  During the last trimester, you have probably had an increase in your appetite. It is normal to have cravings for certain foods. This varies from person to person and pregnancy to pregnancy.  You may begin to get stretch marks on your hips, abdomen, and breasts. These are normal changes in the body   during pregnancy. There are no exercises or medicines to take which prevent this change.  Constipation may be treated with a stool softener or adding bulk to your diet. Drinking lots of fluids, fiber in vegetables, fruits, and whole grains are helpful.  Exercising is also helpful. If you have been very active up until your pregnancy, most of these activities can be continued during your pregnancy. If you have been less active, it is helpful to start an exercise  program such as walking. Consult your caregiver before starting exercise programs.  Avoid all smoking, alcohol, non-prescribed drugs, herbs and "street drugs" during your pregnancy. These chemicals affect the formation and growth of the baby. Avoid chemicals throughout the pregnancy to ensure the delivery of a healthy infant.  Backache, varicose veins, and hemorrhoids may develop or get worse.  You will tire more easily in the third trimester, which is normal.  The baby's movements may be stronger and more often.  You may become short of breath easily.  Your belly button may stick out.  A yellow discharge may leak from your breasts called colostrum.  You may have a bloody mucus discharge. This usually occurs a few days to a week before labor begins. HOME CARE INSTRUCTIONS   Keep your caregiver's appointments. Follow your caregiver's instructions regarding medicine use, exercise, and diet.  During pregnancy, you are providing food for you and your baby. Continue to eat regular, well-balanced meals. Choose foods such as meat, fish, milk and other low fat dairy products, vegetables, fruits, and whole-grain breads and cereals. Your caregiver will tell you of the ideal weight gain.  A physical sexual relationship may be continued throughout pregnancy if there are no other problems such as early (premature) leaking of amniotic fluid from the membranes, vaginal bleeding, or belly (abdominal) pain.  Exercise regularly if there are no restrictions. Check with your caregiver if you are unsure of the safety of your exercises. Greater weight gain will occur in the last 2 trimesters of pregnancy. Exercising helps:  Control your weight.  Get you in shape for labor and delivery.  You lose weight after you deliver.  Rest a lot with legs elevated, or as needed for leg cramps or low back pain.  Wear a good support or jogging bra for breast tenderness during pregnancy. This may help if worn during  sleep. Pads or tissues may be used in the bra if you are leaking colostrum.  Do not use hot tubs, steam rooms, or saunas.  Wear your seat belt when driving. This protects you and your baby if you are in an accident.  Avoid raw meat, cat litter boxes and soil used by cats. These carry germs that can cause birth defects in the baby.  It is easier to leak urine during pregnancy. Tightening up and strengthening the pelvic muscles will help with this problem. You can practice stopping your urination while you are going to the bathroom. These are the same muscles you need to strengthen. It is also the muscles you would use if you were trying to stop from passing gas. You can practice tightening these muscles up 10 times a set and repeating this about 3 times per day. Once you know what muscles to tighten up, do not perform these exercises during urination. It is more likely to cause an infection by backing up the urine.  Ask for help if you have financial, counseling, or nutritional needs during pregnancy. Your caregiver will be able to offer counseling for these   needs as well as refer you for other special needs.  Make a list of emergency phone numbers and have them available.  Plan on getting help from family or friends when you go home from the hospital.  Make a trial run to the hospital.  Take prenatal classes with the father to understand, practice, and ask questions about the labor and delivery.  Prepare the baby's room or nursery.  Do not travel out of the city unless it is absolutely necessary and with the advice of your caregiver.  Wear only low or no heal shoes to have better balance and prevent falling. MEDICINES AND DRUG USE IN PREGNANCY  Take prenatal vitamins as directed. The vitamin should contain 1 milligram of folic acid. Keep all vitamins out of reach of children. Only a couple vitamins or tablets containing iron may be fatal to a baby or young child when ingested.  Avoid use  of all medicines, including herbs, over-the-counter medicines, not prescribed or suggested by your caregiver. Only take over-the-counter or prescription medicines for pain, discomfort, or fever as directed by your caregiver. Do not use aspirin, ibuprofen or naproxen unless approved by your caregiver.  Let your caregiver also know about herbs you may be using.  Alcohol is related to a number of birth defects. This includes fetal alcohol syndrome. All alcohol, in any form, should be avoided completely. Smoking will cause low birth rate and premature babies.  Illegal drugs are very harmful to the baby. They are absolutely forbidden. A baby born to an addicted mother will be addicted at birth. The baby will go through the same withdrawal an adult does. SEEK MEDICAL CARE IF: You have any concerns or worries during your pregnancy. It is better to call with your questions if you feel they cannot wait, rather than worry about them. SEEK IMMEDIATE MEDICAL CARE IF:   An unexplained oral temperature above 102 F (38.9 C) develops, or as your caregiver suggests.  You have leaking of fluid from the vagina. If leaking membranes are suspected, take your temperature and tell your caregiver of this when you call.  There is vaginal spotting, bleeding or passing clots. Tell your caregiver of the amount and how many pads are used.  You develop a bad smelling vaginal discharge with a change in the color from clear to white.  You develop vomiting that lasts more than 24 hours.  You develop chills or fever.  You develop shortness of breath.  You develop burning on urination.  You loose more than 2 pounds of weight or gain more than 2 pounds of weight or as suggested by your caregiver.  You notice sudden swelling of your face, hands, and feet or legs.  You develop belly (abdominal) pain. Round ligament discomfort is a common non-cancerous (benign) cause of abdominal pain in pregnancy. Your caregiver still  must evaluate you.  You develop a severe headache that does not go away.  You develop visual problems, blurred or double vision.  If you have not felt your baby move for more than 1 hour. If you think the baby is not moving as much as usual, eat something with sugar in it and lie down on your left side for an hour. The baby should move at least 4 to 5 times per hour. Call right away if your baby moves less than that.  You fall, are in a car accident, or any kind of trauma.  There is mental or physical violence at home. Document Released: 01/26/2001   Document Revised: 10/27/2011 Document Reviewed: 07/31/2008 [employer] Patient Information 2014 [employer]  Vaginal Delivery Your caregiver must first be sure you are in labor. Signs of labor include:  You may pass what is called "the mucus plug" before labor begins. This is a small amount of blood stained mucus.  Regular uterine contractions.  The time between contractions get closer together.  The discomfort and pain gradually gets more intense.  Pains are mostly located in the back.  Pains get worse when walking.  The cervix (the opening of the uterus) becomes thinner (begins to efface) and opens up (dilates). Once you are in labor and admitted into the hospital or care center, your caregiver will do the following:  A complete physical examination.  Check your vital signs (blood pressure, pulse, temperature and the fetal heart rate).  Do a vaginal examination (using a sterile glove and lubricant) to determine:  The position (presentation) of the baby (head [vertex] or buttock first).  The level (station) of the baby's head in the birth canal.  The effacement and dilatation of the cervix.  You may have your pubic hair shaved and be given an enema depending on your caregiver and the circumstance.  An electronic monitor is usually placed on your abdomen. The monitor follows the length and intensity of the contractions, as  well as the baby's heart rate.  Usually, your caregiver will insert an IV in your arm with a bottle of sugar water. This is done as a precaution so that medications can be given to you quickly during labor or delivery. NORMAL LABOR AND DELIVERY IS DIVIDED UP INTO 3 STAGES: First Stage This is when regular contractions begin and the cervix begins to efface and dilate. This stage can last from 3 to 15 hours. The end of the first stage is when the cervix is 100% effaced and 10 centimeters dilated. Pain medications may be given by   Injection (morphine, demerol, etc.)  Regional anesthesia (spinal, caudal or epidural, anesthetics given in different locations of the spine). Paracervical pain medication may be given, which is an injection of and anesthetic on each side of the cervix. A pregnant woman may request to have "Natural Childbirth" which is not to have any medications or anesthesia during her labor and delivery. Second Stage This is when the baby comes down through the birth canal (vagina) and is born. This can take 1 to 4 hours. As the baby's head comes down through the birth canal, you may feel like you are going to have a bowel movement. You will get the urge to bear down and push until the baby is delivered. As the baby's head is being delivered, the caregiver will decide if an episiotomy (a cut in the perineum and vagina area) is needed to prevent tearing of the tissue in this area. The episiotomy is sewn up after the delivery of the baby and placenta. Sometimes a mask with nitrous oxide is given for the mother to breath during the delivery of the baby to help if there is too much pain. The end of Stage 2 is when the baby is fully delivered. Then when the umbilical cord stops pulsating it is clamped and cut. Third Stage The third stage begins after the baby is completely delivered and ends after the placenta (afterbirth) is delivered. This usually takes 5 to 30 minutes. After the placenta is  delivered, a medication is given either by intravenous or injection to help contract the uterus and prevent bleeding. The   third stage is not painful and pain medication is usually not necessary. If an episiotomy was done, it is repaired at this time. After the delivery, the mother is watched and monitored closely for 1 to 2 hours to make sure there is no postpartum bleeding (hemorrhage). If there is a lot of bleeding, medication is given to contract the uterus and stop the bleeding. Document Released: 11/11/2007 Document Revised: 10/27/2011 Document Reviewed: 11/11/2007 [employer] Patient Information 2014 [employer]   

## 2012-07-12 NOTE — Progress Notes (Signed)
No complaints. GBS done today. Measuring small for dates. Will get sono for growth/AFI

## 2012-07-12 NOTE — Progress Notes (Signed)
Pulse: 84

## 2012-07-14 ENCOUNTER — Ambulatory Visit (HOSPITAL_COMMUNITY)
Admission: RE | Admit: 2012-07-14 | Discharge: 2012-07-14 | Disposition: A | Discharge status: Home / Self Care | Payer: Medicaid Other | Source: Ambulatory Visit | Attending: Family Medicine | Admitting: Family Medicine

## 2012-07-14 DIAGNOSIS — J45901 Unspecified asthma with (acute) exacerbation: Secondary | ICD-10-CM

## 2012-07-14 DIAGNOSIS — O365990 Maternal care for other known or suspected poor fetal growth, unspecified trimester, not applicable or unspecified: Secondary | ICD-10-CM | POA: Insufficient documentation

## 2012-07-14 DIAGNOSIS — O26843 Uterine size-date discrepancy, third trimester: Secondary | ICD-10-CM

## 2012-07-14 DIAGNOSIS — Z3689 Encounter for other specified antenatal screening: Secondary | ICD-10-CM | POA: Insufficient documentation

## 2012-07-14 DIAGNOSIS — O99340 Other mental disorders complicating pregnancy, unspecified trimester: Secondary | ICD-10-CM

## 2012-07-14 DIAGNOSIS — Z3493 Encounter for supervision of normal pregnancy, unspecified, third trimester: Secondary | ICD-10-CM

## 2012-07-16 ENCOUNTER — Encounter: Payer: Self-pay | Admitting: Family Medicine

## 2012-07-19 ENCOUNTER — Ambulatory Visit (INDEPENDENT_AMBULATORY_CARE_PROVIDER_SITE_OTHER): Payer: Medicaid Other | Admitting: Advanced Practice Midwife

## 2012-07-19 VITALS — BP 106/72 | Temp 98.2°F | Wt 178.0 lb

## 2012-07-19 DIAGNOSIS — O99340 Other mental disorders complicating pregnancy, unspecified trimester: Secondary | ICD-10-CM

## 2012-07-19 DIAGNOSIS — Z3483 Encounter for supervision of other normal pregnancy, third trimester: Secondary | ICD-10-CM

## 2012-07-19 LAB — POCT URINALYSIS DIP (DEVICE)
Bilirubin Urine: NEGATIVE
Glucose, UA: NEGATIVE mg/dL
Hgb urine dipstick: NEGATIVE
Nitrite: NEGATIVE
Urobilinogen, UA: 0.2 mg/dL (ref 0.0–1.0)

## 2012-07-19 NOTE — Progress Notes (Signed)
I have seen the patient with the resident/student and agree with the above.  Hogan, Heather Donovan  

## 2012-07-19 NOTE — Progress Notes (Signed)
Pulse: 100

## 2012-07-19 NOTE — Patient Instructions (Signed)

## 2012-07-19 NOTE — Progress Notes (Signed)
Michelle Faulkner .22 y.o. [redacted]w[redacted]d G2P1001 presents for her scheduled prenatal appointment. She has no complaints today. No leaking, bleeding or contractions.   O: BP 106/72  Temp(Src) 98.2 F (36.8 C)  Wt 178 lb (80.74 kg)  BMI 30.54 kg/m2  LMP 11/13/2011 Abd: gravid  A/P:  - Previously measured small for dates, had [place] completed and measured appropriately.  - Kick counts reviewed - labor precautions reviewed.  - Baby boy, desires circ - GBS negative, A positive - Uncertain of BC options  F/U: 1 week

## 2012-07-27 ENCOUNTER — Ambulatory Visit (INDEPENDENT_AMBULATORY_CARE_PROVIDER_SITE_OTHER): Payer: Medicaid Other | Admitting: Obstetrics and Gynecology

## 2012-07-27 VITALS — BP 112/78 | Wt 180.6 lb

## 2012-07-27 DIAGNOSIS — Z3493 Encounter for supervision of normal pregnancy, unspecified, third trimester: Secondary | ICD-10-CM

## 2012-07-27 DIAGNOSIS — O99340 Other mental disorders complicating pregnancy, unspecified trimester: Secondary | ICD-10-CM

## 2012-07-27 LAB — POCT URINALYSIS DIP (DEVICE)
Bilirubin Urine: NEGATIVE
Ketones, ur: NEGATIVE mg/dL
Leukocytes, UA: NEGATIVE
Nitrite: NEGATIVE
Protein, ur: NEGATIVE mg/dL
pH: 7 (ref 5.0–8.0)

## 2012-07-27 NOTE — Progress Notes (Signed)
Doing well. Wants minimal interventions in labor. Still undecided about contraception, probably Nexplanon.

## 2012-07-27 NOTE — Patient Instructions (Signed)
Vaginal Delivery Your caregiver must first be sure you are in labor. Signs of labor include:  You may pass what is called "the mucus plug" before labor begins. This is a small amount of blood stained mucus.  Regular uterine contractions.  The time between contractions get closer together.  The discomfort and pain gradually gets more intense. Pains are mostly located in [employer] Contraception (birth control) is the use of any methods or devices to prevent pregnancy. Below are some methods to help avoid pregnancy. HORMONAL METHODS  Contraceptive implant. This is a thin, plastic tube containing progesterone hormone. It does not contain estrogen hormone. Your caregiver inserts the tube in the inner part of the upper arm. The tube can remain in place for up to 3 years. After 3 years, the implant must be removed. The implant prevents the ovaries from releasing an egg (ovulation), thickens the cervical mucus which prevents sperm from entering the uterus, and thins the lining of the inside of the uterus. Progesterone-only injections. These injections are given every 3 months by your caregiver to prevent pregnancy. This synthetic progesterone hormone stops the ovaries from releasing eggs. It also thickens cervical mucus and changes the uterine lining. This makes it harder for sperm to survive in the uterus. Birth control pills. These pills contain estrogen and progesterone hormone. They work by stopping the egg from forming in the ovary (ovulation). Birth control pills are prescribed by a caregiver.Birth control pills can also be used to treat heavy periods. Minipill. This type of birth control pill contains only the progesterone hormone. They are taken every day of each month and must be prescribed by your caregiver. Birth control patch. The patch contains hormones similar to those in birth control pills. It must be changed once a week and is prescribed by a caregiver. Vaginal ring. The  ring contains hormones similar to those in birth control pills. It is left in the vagina for 3 weeks, removed for 1 week, and then a new one is put back in place. The patient must be comfortable inserting and removing the ring from the vagina.A caregiver's prescription is necessary. Emergency contraception. Emergency contraceptives prevent pregnancy after unprotected sexual intercourse. This pill can be taken right after sex or up to 5 days after unprotected sex. It is most effective the sooner you take the pills after having sexual intercourse. Emergency contraceptive pills are available without a prescription. Check with your pharmacist. Do not use emergency contraception as your only form of birth control. BARRIER METHODS  Female condom. This is a thin sheath (latex or rubber) that is worn over the penis during sexual intercourse. It can be used with spermicide to increase effectiveness. Female condom. This is a soft, loose-fitting sheath that is put into the vagina before sexual intercourse. Diaphragm. This is a soft, latex, dome-shaped barrier that must be fitted by a caregiver. It is inserted into the vagina, along with a spermicidal jelly. It is inserted before intercourse. The diaphragm should be left in the vagina for 6 to 8 hours after intercourse. Cervical cap. This is a round, soft, latex or plastic cup that fits over the cervix and must be fitted by a caregiver. The cap can be left in place for up to 48 hours after intercourse. Sponge. This is a soft, circular piece of polyurethane foam. The sponge has spermicide in it. It is inserted into the vagina after wetting it and before sexual intercourse. Spermicides. These are chemicals that kill or block sperm from entering  the cervix and uterus. They come in the form of creams, jellies, suppositories, foam, or tablets. They do not require a prescription. They are inserted into the vagina with an applicator before having sexual intercourse. The process  must be repeated every time you have sexual intercourse. INTRAUTERINE CONTRACEPTION Intrauterine device (IUD). This is a T-shaped device that is put in a woman's uterus during a menstrual period to prevent pregnancy. There are 2 types: Copper IUD. This type of IUD is wrapped in copper wire and is placed inside the uterus. Copper makes the uterus and fallopian tubes produce a fluid that kills sperm. It can stay in place for 10 years. Hormone IUD. This type of IUD contains the hormone progestin (synthetic progesterone). The hormone thickens the cervical mucus and prevents sperm from entering the uterus, and it also thins the uterine lining to prevent implantation of a fertilized egg. The hormone can weaken or kill the sperm that get into the uterus. It can stay in place for 5 years. PERMANENT METHODS OF CONTRACEPTION Female tubal ligation. This is when the woman's fallopian tubes are surgically sealed, tied, or blocked to prevent the egg from traveling to the uterus. Female sterilization. This is when the female has the tubes that carry sperm tied off (vasectomy).This blocks sperm from entering the vagina during sexual intercourse. After the procedure, the man can still ejaculate fluid (semen). NATURAL PLANNING METHODS Natural family planning. This is not having sexual intercourse or using a barrier method (condom, diaphragm, cervical cap) on days the woman could become pregnant. Calendar method. This is keeping track of the length of each menstrual cycle and identifying when you are fertile. Ovulation method. This is avoiding sexual intercourse during ovulation. Symptothermal method. This is avoiding sexual intercourse during ovulation, using a thermometer and ovulation symptoms. Post-ovulation method. This is timing sexual intercourse after you have ovulated. Regardless of which type or method of contraception you choose, it is important that you use condoms to protect against the transmission of sexually  transmitted diseases (STDs). Talk with your caregiver about which form of contraception is most appropriate for you. Document Released: 02/01/2005 Document Revised: 04/26/2011 Document Reviewed: 06/10/2010 [facility] Patient Information 2014 [place], [place].    The cervix (the opening of the uterus) becomes thinner (begins to efface) and opens up (dilates). Once you are in labor and admitted into the hospital or care center, your caregiver will do the following:  A complete physical examination.  Check your vital signs (blood pressure, pulse, temperature and the fetal heart rate).  Do a vaginal examination (using a sterile glove and lubricant) to determine:  The position (presentation) of the baby (head [vertex] or buttock first).  The level (station) of the baby's head in the birth canal.  The effacement and dilatation of the cervix.  You may have your pubic hair shaved and be given an enema depending on your caregiver and the circumstance.  An electronic monitor is usually placed on your abdomen. The monitor follows the length and intensity of the contractions, as well as the baby's heart rate.  Usually, your caregiver will insert an IV in your arm with a bottle of sugar water. This is done as a precaution so that medications can be given to you quickly during labor or delivery. NORMAL LABOR AND DELIVERY IS DIVIDED UP INTO 3 STAGES: First Stage This is when regular contractions begin and the cervix begins to efface and dilate. This stage can last from 3 to 15 hours. The end of the  first stage is when the cervix is 100% effaced and 10 centimeters dilated. Pain medications may be given by   Injection (morphine, demerol, etc.)  Regional anesthesia (spinal, caudal or epidural, anesthetics given in different locations of the spine). Paracervical pain medication may be given, which is an injection of and anesthetic on each side of the cervix. A pregnant woman may request to have "Natural  Childbirth" which is not to have any medications or anesthesia during her labor and delivery. Second Stage This is when the baby comes down through the birth canal (vagina) and is born. This can take 1 to 4 hours. As the baby's head comes down through the birth canal, you may feel like you are going to have a bowel movement. You will get the urge to bear down and push until the baby is delivered. As the baby's head is being delivered, the caregiver will decide if an episiotomy (a cut in the perineum and vagina area) is needed to prevent tearing of the tissue in this area. The episiotomy is sewn up after the delivery of the baby and placenta. Sometimes a mask with nitrous oxide is given for the mother to breath during the delivery of the baby to help if there is too much pain. The end of Stage 2 is when the baby is fully delivered. Then when the umbilical cord stops pulsating it is clamped and cut. Third Stage The third stage begins after the baby is completely delivered and ends after the placenta (afterbirth) is delivered. This usually takes 5 to 30 minutes. After the placenta is delivered, a medication is given either by intravenous or injection to help contract the uterus and prevent bleeding. The third stage is not painful and pain medication is usually not necessary. If an episiotomy was done, it is repaired at this time. After the delivery, the mother is watched and monitored closely for 1 to 2 hours to make sure there is no postpartum bleeding (hemorrhage). If there is a lot of bleeding, medication is given to contract the uterus and stop the bleeding. Document Released: 11/11/2007 Document Revised: 10/27/2011 Document Reviewed: 11/11/2007 [facility] Patient Information 2014 [place], [place].

## 2012-07-27 NOTE — Progress Notes (Signed)
Pulse: 83

## 2012-08-04 ENCOUNTER — Encounter (HOSPITAL_COMMUNITY): Payer: Self-pay | Admitting: Advanced Practice Midwife

## 2012-08-04 ENCOUNTER — Inpatient Hospital Stay (HOSPITAL_COMMUNITY)
Admission: AD | Admit: 2012-08-04 | Discharge: 2012-08-05 | DRG: 775 | Disposition: A | Discharge status: Home / Self Care | Payer: Medicaid Other | Source: Ambulatory Visit | Attending: Obstetrics and Gynecology | Admitting: Obstetrics and Gynecology

## 2012-08-04 DIAGNOSIS — O99340 Other mental disorders complicating pregnancy, unspecified trimester: Secondary | ICD-10-CM

## 2012-08-04 DIAGNOSIS — IMO0001 Reserved for inherently not codable concepts without codable children: Secondary | ICD-10-CM

## 2012-08-04 DIAGNOSIS — Z3490 Encounter for supervision of normal pregnancy, unspecified, unspecified trimester: Secondary | ICD-10-CM

## 2012-08-04 DIAGNOSIS — J45901 Unspecified asthma with (acute) exacerbation: Secondary | ICD-10-CM

## 2012-08-04 LAB — CBC
HCT: 30.1 % — ABNORMAL LOW (ref 36.0–46.0)
MCV: 85.8 fL (ref 78.0–100.0)
Platelets: 230 10*3/uL (ref 150–400)
RBC: 3.51 MIL/uL — ABNORMAL LOW (ref 3.87–5.11)
WBC: 8.1 10*3/uL (ref 4.0–10.5)

## 2012-08-04 LAB — OB RESULTS CONSOLE GBS: GBS: NEGATIVE

## 2012-08-04 MED ORDER — CITRIC ACID-SODIUM CITRATE 334-500 MG/5ML PO SOLN: 30.0000 mL | ORAL | Status: DC | PRN

## 2012-08-04 MED ORDER — OXYCODONE-ACETAMINOPHEN 5-325 MG PO TABS: 1.0000 | ORAL_TABLET | ORAL | Status: DC | PRN

## 2012-08-04 MED ORDER — SENNOSIDES-DOCUSATE SODIUM 8.6-50 MG PO TABS
2.0000 | ORAL_TABLET | Freq: Every day | ORAL | Status: DC
  Administered 2012-08-04: 2 via ORAL

## 2012-08-04 MED ORDER — PHENYLEPHRINE 40 MCG/ML (10ML) SYRINGE FOR IV PUSH (FOR BLOOD PRESSURE SUPPORT)
80.0000 ug | PREFILLED_SYRINGE | INTRAVENOUS | Status: DC | PRN
  Filled 2012-08-04: qty 2

## 2012-08-04 MED ORDER — ZOLPIDEM TARTRATE 5 MG PO TABS: 5.0000 mg | ORAL_TABLET | Freq: Every evening | ORAL | Status: DC | PRN

## 2012-08-04 MED ORDER — ONDANSETRON HCL 4 MG PO TABS: 4.0000 mg | ORAL_TABLET | ORAL | Status: DC | PRN

## 2012-08-04 MED ORDER — TETANUS-DIPHTH-ACELL PERTUSSIS 5-2.5-18.5 LF-MCG/0.5 IM SUSP: 0.5000 mL | Freq: Once | INTRAMUSCULAR | Status: DC

## 2012-08-04 MED ORDER — EPHEDRINE 5 MG/ML INJ
10.0000 mg | INTRAVENOUS | Status: DC | PRN
  Filled 2012-08-04: qty 2

## 2012-08-04 MED ORDER — DIBUCAINE 1 % RE OINT: 1.0000 "application " | TOPICAL_OINTMENT | RECTAL | Status: DC | PRN

## 2012-08-04 MED ORDER — PRENATAL MULTIVITAMIN CH
1.0000 | ORAL_TABLET | Freq: Every day | ORAL | Status: DC
  Administered 2012-08-04 – 2012-08-05 (×2): 1 via ORAL
  Filled 2012-08-04 (×2): qty 1

## 2012-08-04 MED ORDER — IBUPROFEN 600 MG PO TABS
600.0000 mg | ORAL_TABLET | Freq: Four times a day (QID) | ORAL | Status: DC
  Administered 2012-08-04 – 2012-08-05 (×5): 600 mg via ORAL
  Filled 2012-08-04 (×5): qty 1

## 2012-08-04 MED ORDER — LACTATED RINGERS IV SOLN: INTRAVENOUS | Status: DC

## 2012-08-04 MED ORDER — DIPHENHYDRAMINE HCL 25 MG PO CAPS: 25.0000 mg | ORAL_CAPSULE | Freq: Four times a day (QID) | ORAL | Status: DC | PRN

## 2012-08-04 MED ORDER — LIDOCAINE HCL (PF) 1 % IJ SOLN
30.0000 mL | INTRAMUSCULAR | Status: DC | PRN
  Filled 2012-08-04 (×2): qty 30

## 2012-08-04 MED ORDER — WITCH HAZEL-GLYCERIN EX PADS: 1.0000 "application " | MEDICATED_PAD | CUTANEOUS | Status: DC | PRN

## 2012-08-04 MED ORDER — OXYTOCIN BOLUS FROM INFUSION: 500.0000 mL | INTRAVENOUS | Status: DC

## 2012-08-04 MED ORDER — LACTATED RINGERS IV SOLN: 500.0000 mL | INTRAVENOUS | Status: DC | PRN

## 2012-08-04 MED ORDER — FENTANYL 2.5 MCG/ML BUPIVACAINE 1/10 % EPIDURAL INFUSION (WH - ANES): 14.0000 mL/h | INTRAMUSCULAR | Status: DC | PRN

## 2012-08-04 MED ORDER — BENZOCAINE-MENTHOL 20-0.5 % EX AERO: 1.0000 "application " | INHALATION_SPRAY | CUTANEOUS | Status: DC | PRN

## 2012-08-04 MED ORDER — LACTATED RINGERS IV SOLN: 500.0000 mL | Freq: Once | INTRAVENOUS | Status: DC

## 2012-08-04 MED ORDER — OXYTOCIN 40 UNITS IN LACTATED RINGERS INFUSION - SIMPLE MED
62.5000 mL/h | INTRAVENOUS | Status: DC
  Administered 2012-08-04 (×2): 62.5 mL/h via INTRAVENOUS
  Filled 2012-08-04: qty 1000

## 2012-08-04 MED ORDER — ACETAMINOPHEN 325 MG PO TABS: 650.0000 mg | ORAL_TABLET | ORAL | Status: DC | PRN

## 2012-08-04 MED ORDER — SIMETHICONE 80 MG PO CHEW: 80.0000 mg | CHEWABLE_TABLET | ORAL | Status: DC | PRN

## 2012-08-04 MED ORDER — OXYCODONE-ACETAMINOPHEN 5-325 MG PO TABS
1.0000 | ORAL_TABLET | ORAL | Status: DC | PRN
  Administered 2012-08-04: 1 via ORAL
  Filled 2012-08-04: qty 1

## 2012-08-04 MED ORDER — ONDANSETRON HCL 4 MG/2ML IJ SOLN: 4.0000 mg | Freq: Four times a day (QID) | INTRAMUSCULAR | Status: DC | PRN

## 2012-08-04 MED ORDER — FLEET ENEMA 7-19 GM/118ML RE ENEM: 1.0000 | ENEMA | RECTAL | Status: DC | PRN

## 2012-08-04 MED ORDER — IBUPROFEN 600 MG PO TABS
600.0000 mg | ORAL_TABLET | Freq: Four times a day (QID) | ORAL | Status: DC | PRN
  Administered 2012-08-04: 600 mg via ORAL
  Filled 2012-08-04: qty 1

## 2012-08-04 MED ORDER — CITALOPRAM HYDROBROMIDE 20 MG PO TABS
20.0000 mg | ORAL_TABLET | Freq: Every day | ORAL | Status: DC
  Administered 2012-08-04 – 2012-08-05 (×2): 20 mg via ORAL
  Filled 2012-08-04 (×4): qty 1

## 2012-08-04 MED ORDER — LANOLIN HYDROUS EX OINT: TOPICAL_OINTMENT | CUTANEOUS | Status: DC | PRN

## 2012-08-04 MED ORDER — ONDANSETRON HCL 4 MG/2ML IJ SOLN: 4.0000 mg | INTRAMUSCULAR | Status: DC | PRN

## 2012-08-04 MED ORDER — DIPHENHYDRAMINE HCL 50 MG/ML IJ SOLN: 12.5000 mg | INTRAMUSCULAR | Status: DC | PRN

## 2012-08-04 NOTE — Progress Notes (Signed)
Pt vomiting during pushing, unable to trace FHR.

## 2012-08-04 NOTE — Progress Notes (Signed)
UR completed 

## 2012-08-04 NOTE — Progress Notes (Signed)
Pt transferred to room 132 via wheelchair, fob pushing wheelchair with baby in mothers arms.

## 2012-08-04 NOTE — MAU Note (Signed)
Contractions. No bleeding or leaking of fluid. Was 2.5 cm in clinic.

## 2012-08-05 MED ORDER — IBUPROFEN 600 MG PO TABS: 600.0000 mg | ORAL_TABLET | Freq: Four times a day (QID) | ORAL | Status: DC

## 2012-08-05 MED ORDER — MEASLES, MUMPS & RUBELLA VAC ~~LOC~~ INJ
0.5000 mL | INJECTION | Freq: Once | SUBCUTANEOUS | Status: AC
  Administered 2012-08-05: 0.5 mL via SUBCUTANEOUS
  Filled 2012-08-05: qty 0.5

## 2012-08-05 MED ORDER — ACETAMINOPHEN-CODEINE 300-30 MG PO TABS: 1.0000 | ORAL_TABLET | ORAL | Status: DC | PRN

## 2012-08-05 NOTE — Progress Notes (Signed)
CSW spoke with MOB about hx of dep/anx, as well as hx of MJ use.  No current concerns or barriers to discharge at this time.  CSW will follow for MEC results and make report if needed.  Full consult report to follow.    [phone] 

## 2012-08-05 NOTE — Lactation Note (Signed)
This note was copied from the chart of Michelle Faulkner. Lactation Consultation Note Mom states br feeding is going well; baby is latched on the left when I enter room; wide latch and gulping. Mom states he just finished on the right for 20 minutes. Mom states right side is painful with blister. Comfort gels provided with instructions for use. Inst mom that it is ok to take a break from the right if it is too painful to latch; inst mom to pump and/ or hand express if she does rest that side. Written inst provided.  Enc mom to call lactation office if she has any concerns, and to attend the BFSG. Questions answered.   Patient Name: Michelle Faulkner [id number] Date: 08/05/2012 Reason for consult: Follow-up assessment   Maternal Data    Feeding Feeding Type: Breast Milk Feeding method: Breast Length of feed: 30 min  LATCH Score/Interventions Latch: Grasps breast easily, tongue down, lips flanged, rhythmical sucking.  Audible Swallowing: Spontaneous and intermittent  Type of Nipple: Everted at rest and after stimulation  Comfort (Breast/Nipple): Soft / non-tender (right side blister; painful)     Hold (Positioning): No assistance needed to correctly position infant at breast. Intervention(s): Breastfeeding basics reviewed;Support Pillows;Position options;Skin to skin  LATCH Score: 10  Lactation Tools Discussed/Used Tools: Pump Breast pump type: Manual   Consult Status Consult Status: Complete    Lenard Forth 08/05/2012, 12:15 PM

## 2012-08-05 NOTE — Discharge Summary (Signed)
Obstetric Discharge Summary Reason for Admission: onset of labor Prenatal Procedures: NST and ultrasound Intrapartum Procedures: spontaneous vaginal delivery Postpartum Procedures: none Complications-Operative and Postpartum: none Hemoglobin  Date Value Range Status  08/04/2012 10.1* 12.0 - 15.0 g/dL Final     HCT  Date Value Range Status  08/04/2012 30.1* 36.0 - 46.0 % Final   Pt breastfeeding, desires nexplanon for birth control.  Physical Exam:  Filed Vitals:   08/05/12 0635  BP: 137/75  Pulse: 78  Temp: 98.3 F (36.8 C)  Resp: 18   General: alert, cooperative and appears stated age Lochia: appropriate Uterine Fundus: firm Incision: n/a DVT Evaluation: No evidence of DVT seen on physical exam. Negative Homan's sign.  Discharge Diagnoses: Term Pregnancy-delivered  Discharge Information: Date: 08/05/2012 Activity: pelvic rest Diet: routine Medications: Tylenol #3 and Ibuprofen Condition: stable Instructions: refer to practice specific booklet Discharge to: home Follow-up Information   Follow up with [facility]. Call in 4 weeks.   Contact information:   [address] [place] [address]       Newborn Data: Live born female  Birth Weight: 6 lb 0.5 oz (2736 g) APGAR: 8, 9  Home with mother.  [facility] 08/05/2012, 7:01 AM

## 2012-08-06 NOTE — Clinical Social Work Note (Signed)
Late Entry   Clinical Social Work Department PSYCHOSOCIAL ASSESSMENT - MATERNAL/CHILD 08/06/2012  Patient:  Michelle Faulkner, Michelle Faulkner  Account Number:  [id number]  Admit Date:  08/04/2012  Marjo Bicker Name:   Jonetta Osgood    Clinical Social Worker:  Truman Hayward, LCSW   Date/Time:  08/05/2012 02:00 PM  Date Referred:  08/05/2012   Referral source  Physician  RN     Referred reason  Substance Abuse  Depression/Anxiety   Other referral source:    I:  FAMILY / HOME ENVIRONMENT Child's legal guardian:  PARENT  Guardian - Name Guardian - Age Guardian - Address  Michelle Faulkner 22 [address] [place], [place] [address]  did not get name     Other household support members/support persons Name Relationship DOB  lives with FOB     Other support:   MOB and FOB report good family support    II  PSYCHOSOCIAL DATA Information Source:  Patient Interview  Event organiser Employment:   MOB unemployed  FOB in school at [employer] resources:  Medicaid If Medicaid - [place]:  [employer] Other  Sales executive  WIC   School / Grade:   Maternity Care Coordinator / Child Services Coordination / Early Interventions:  Cultural issues impacting care:    III  STRENGTHS Strengths  Adequate Resources  Home prepared for Child (including basic supplies)  Compliance with medical plan  Supportive family/friends   Strength comment:    IV  RISK FACTORS AND CURRENT PROBLEMS Current Problem:  None   Risk Factor & Current Problem Patient Issue Family Issue Risk Factor / Current Problem Comment   N N     V  SOCIAL WORK ASSESSMENT CSW spoke with MOB and FOB in room.  CSW discussed emotional cocerns.  MOB reports being diagnosed with depression and symptoms are currently being managed with Celexa medication.  CSW discussed supploes and family support.  MOB and FOB report good family support and no concerns at this time with supplies.  CSW discussed hx of MJ use.  MOB reports  this was in Oct 2013 and no curren concerns.  UDS negative.  CSW discussed hospital policy to drug screen infant and MOB was understanding.  No barriers to discharge at this time.  Please reconsult CSW if further needs arise.      VI SOCIAL WORK PLAN Social Work Plan  No Further Intervention Required / No Barriers to Discharge   Type of pt/family education:   If child protective services report - county:   If child protective services report - date:   Information/referral to community resources comment:   Other social work plan:

## 2012-08-07 ENCOUNTER — Encounter: Payer: Medicaid Other | Admitting: Family Medicine

## 2012-08-07 NOTE — H&P (Signed)
Michelle Faulkner is a 22 y.o. female presenting for Labor. Maternal Medical History:  Reason for admission: Contractions.  Nausea.  Contractions: Onset was 1-2 hours ago.   Frequency: regular.   Perceived severity is strong.    Fetal activity: Perceived fetal activity is normal.   Last perceived fetal movement was within the past hour.    Prenatal complications: No bleeding.   Prenatal Complications - Diabetes: none.    OB History   Grav Para Term Preterm Abortions TAB SAB Ect Mult Living   2 2 2       2      Past Medical History  Diagnosis Date  . Asthma   . Anxiety   . Anemia   . Depression    Past Surgical History  Procedure Laterality Date  . Wisdom tooth extraction     Family History: family history includes Anxiety disorder in her mother; Asthma in her mother; Diabetes in her mother; and Ulcers in her sister.  There is no history of Other. Social History:  reports that she has been passively smoking Cigarettes.  She has been smoking about 0.00 packs per day. She has never used smokeless tobacco. She reports that she uses illicit drugs (Marijuana). She reports that she does not drink alcohol.   Prenatal Transfer Tool  Maternal Diabetes: No Genetic Screening: Normal Maternal Ultrasounds/Referrals: Normal Fetal Ultrasounds or other Referrals:  None Maternal Substance Abuse:  No Significant Maternal Medications:  None Significant Maternal Lab Results:  None Other Comments:  None  Review of Systems  Constitutional: Negative for fever and malaise/fatigue.  Gastrointestinal: Positive for abdominal pain. Negative for nausea, vomiting, diarrhea and constipation.  Genitourinary: Negative for dysuria.  Neurological: Negative for dizziness and headaches.    Dilation: 10 Effacement (%): 100 Station: +1 Exam by:: M.Vyncent Overby Blood pressure 137/75, pulse 78, temperature 98.3 F (36.8 C), temperature source Oral, resp. rate 18, last menstrual period 11/13/2011, SpO2  98.00%, unknown if currently breastfeeding. Maternal Exam:  Uterine Assessment: Contraction strength is firm.  Contraction frequency is regular.   Abdomen: Fundal height is 39.   Estimated fetal weight is 6.   Fetal presentation: vertex  Introitus: Normal vulva. Vagina is positive for vaginal discharge.  Ferning test: not done.  Nitrazine test: not done. Amniotic fluid character: not assessed.  Pelvis: adequate for delivery.   Cervix: Cervix evaluated by digital exam.     Fetal Exam Fetal Monitor Review: Mode: ultrasound.   Baseline rate: 140.  Variability: moderate (6-25 bpm).   Pattern: accelerations present.    Fetal State Assessment: Category I - tracings are normal.     Physical Exam  Constitutional: She is oriented to person, place, and time. She appears well-developed and well-nourished. She appears distressed (with labor).  HENT:  Head: Normocephalic.  Cardiovascular: Normal rate.   Respiratory: Effort normal.  GI: Soft. There is no tenderness. There is no rebound.  Genitourinary: Uterus normal. Vaginal discharge found.  Cervix 7cm  Musculoskeletal: Normal range of motion.  Neurological: She is alert and oriented to person, place, and time.  Skin: Skin is warm and dry.  Psychiatric: She has a normal mood and affect.    Prenatal labs: ABO, Rh: A/POS/-- (02/27 1100) Antibody: NEG (02/27 1100) Rubella: 0.79 (02/27 1100) RPR: NON REACTIVE (06/20 0105)  HBsAg: NEGATIVE (02/27 1100)  HIV: NON REACTIVE (03/26 1126)  GBS: Negative (06/20 0000)   Assessment/Plan: A:  SIUP at [redacted]w[redacted]d       Active Labor, Transition  P:  Admit to [facility]      Routine Labor orders      Antcipate SVD   [facility] 08/07/2012, 11:51 PM

## 2012-08-10 NOTE — H&P (Signed)
Attestation of Attending Supervision of Advanced Practitioner (CNM/NP): Evaluation and management procedures were performed by the Advanced Practitioner under my supervision and collaboration.  I have reviewed the Advanced Practitioner's note and chart, and I agree with the management and plan.  Fatma Rutten 08/10/2012 2:29 PM

## 2012-08-14 NOTE — Discharge Summary (Signed)
Attestation of Attending Supervision of Advanced Practitioner (CNM/NP): Evaluation and management procedures were performed by the Advanced Practitioner under my supervision and collaboration. I have reviewed the Advanced Practitioner's note and chart, and I agree with the management and plan.  LEGGETT,KELLY H. 10:35 PM

## 2012-09-08 ENCOUNTER — Ambulatory Visit (INDEPENDENT_AMBULATORY_CARE_PROVIDER_SITE_OTHER): Payer: Medicaid Other | Admitting: Family Medicine

## 2012-09-08 ENCOUNTER — Encounter: Payer: Self-pay | Admitting: Obstetrics and Gynecology

## 2012-09-08 DIAGNOSIS — Z309 Encounter for contraceptive management, unspecified: Secondary | ICD-10-CM

## 2012-09-08 DIAGNOSIS — Z01812 Encounter for preprocedural laboratory examination: Secondary | ICD-10-CM

## 2012-09-08 DIAGNOSIS — Z975 Presence of (intrauterine) contraceptive device: Secondary | ICD-10-CM | POA: Insufficient documentation

## 2012-09-08 DIAGNOSIS — Z30017 Encounter for initial prescription of implantable subdermal contraceptive: Secondary | ICD-10-CM

## 2012-09-08 LAB — POCT PREGNANCY, URINE: Preg Test, Ur: NEGATIVE

## 2012-09-08 MED ORDER — ETONOGESTREL 68 MG ~~LOC~~ IMPL
68.0000 mg | DRUG_IMPLANT | Freq: Once | SUBCUTANEOUS | Status: AC
  Administered 2012-09-08: 68 mg via SUBCUTANEOUS

## 2012-09-08 NOTE — Patient Instructions (Signed)

## 2012-09-08 NOTE — Progress Notes (Signed)
Patient ID: Michelle Faulkner, female   DOB: May 01, 1990, 22 y.o.   MRN: [id number] Subjective:    Michelle Faulkner is a 21 y.o. [id number] African American female who presents for a postpartum visit. She is 5 weeks postpartum following a spontaneous vaginal delivery. I have fully reviewed the prenatal and intrapartum course. The delivery was at 40 gestational weeks. Outcome: spontaneous vaginal delivery. Anesthesia: none. Postpartum course has been uncomplicated. Baby's course has been uncomplicated. Baby is feeding by breast. Bleeding no bleeding. Bowel function is normal. Bladder function is normal. Patient is not sexually active. Contraception method is Nexplanon. Postpartum depression screening: negative.  - not working currently - has support from FOB - sleeping well  The following portions of the patient's history were reviewed and updated as appropriate: allergies, current medications, past medical history, past surgical history and problem list.  Review of Systems Pertinent items are noted in HPI.   Filed Vitals:   09/08/12 1054  BP: 103/72  Pulse: 80  Temp: 98.2 F (36.8 C)  TempSrc: Oral  Height: 5\' 3"  (1.6 m)  Weight: 164 lb 9.6 oz (74.662 kg)    Objective:     General:  alert, cooperative and no distress   Breasts:  deferred, no complaints  Lungs: clear to auscultation bilaterally  Heart:  regular rate and rhythm  Abdomen: soft, nontender   Vulva: Normal, mild hypertrophy and irritation.   Vagina: normal vagina  Cervix:  closed  Corpus: Well-involuted  Adnexa:  Non-palpable  Rectal Exam: no hemorrhoids        Assessment:   5week postpartum exam s/p SVD- normal   Depression screening Contraception counseling   Plan:   Contraception: Nexplanon and see procedure note below Follow up in: 11 months for gyn exam given LSIL on PAP 07/2012 or as needed.  - no concerns postpartum - rash appears to be more liley contact dermatitis but with evidence of hypertrophy. Trial  of triamcinolone x 2 weeks to see if improves. Stop natural soaps.   PROCEDURE NOTE: Her left arm, approximatly 4 inches proximal from the elbow, was cleansed with alcohol and anesthetized with 5cc of 1% Lidocaine.  The area was cleansed again and the Nexplanon was inserted without difficulty.  A pressure bandage was applied.  Pt was instructed to remove pressure bandage in a few hours, and keep insertion site covered with a bandaid for 3 days.  Back up contraception was recommended for 1week.  Follow-up scheduled PRN problems  Harrison Zetina L, MD  09/08/2012 11:46 AM

## 2012-09-15 ENCOUNTER — Encounter: Payer: Self-pay | Admitting: *Deleted

## 2012-12-21 ENCOUNTER — Other Ambulatory Visit: Payer: Self-pay

## 2013-04-18 ENCOUNTER — Other Ambulatory Visit (HOSPITAL_COMMUNITY)
Admission: RE | Admit: 2013-04-18 | Discharge: 2013-04-18 | Disposition: A | Discharge status: Home / Self Care | Payer: Medicaid Other | Source: Ambulatory Visit | Attending: Family Medicine | Admitting: Family Medicine

## 2013-04-18 ENCOUNTER — Other Ambulatory Visit: Payer: Self-pay | Admitting: Family Medicine

## 2013-04-18 DIAGNOSIS — Z0000 Encounter for general adult medical examination without abnormal findings: Secondary | ICD-10-CM | POA: Insufficient documentation

## 2013-12-17 ENCOUNTER — Encounter: Payer: Self-pay | Admitting: Obstetrics and Gynecology

## 2014-02-04 IMAGING — US US OB TRANSVAGINAL
1 series · 14 of 28 positions shown · non-contrast
Comparison: None.

CLINICAL DATA: First trimester pregnancy.  Spotting.  Pelvic pain.

OBSTETRIC <14 WK US AND TRANSVAGINAL OB US
TECHNIQUE: Both transabdominal and transvaginal ultrasound
examinations were performed for complete evaluation of the
gestation as well as the maternal uterus, adnexal regions, and
pelvic cul-de-sac.  Transvaginal technique was performed to assess
early pregnancy.

[Series 1: us ob comp less 14 wks · 14 of 44 slices shown]
[im 2/44]
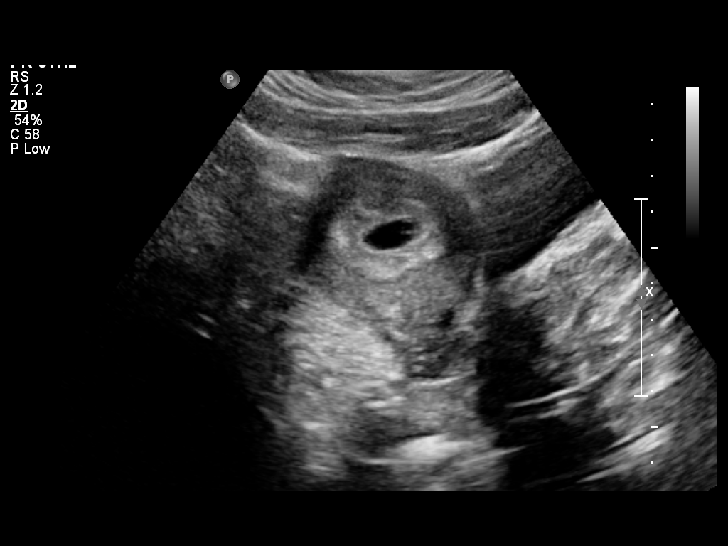
[im 5/44]
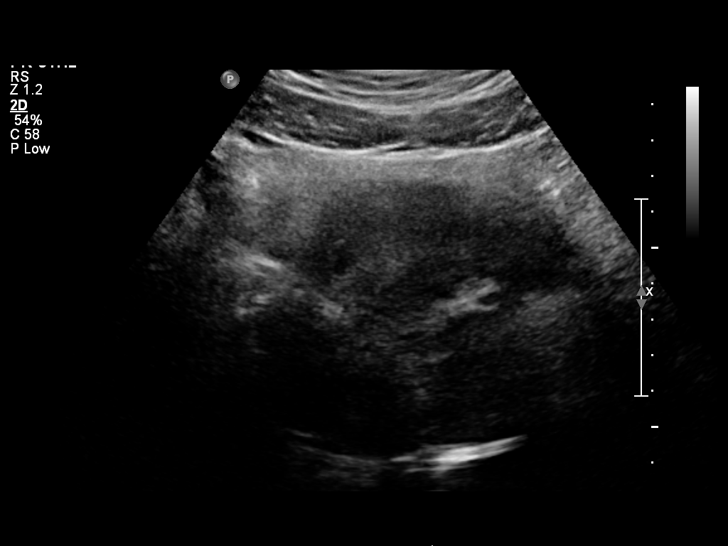
[im 8/44]
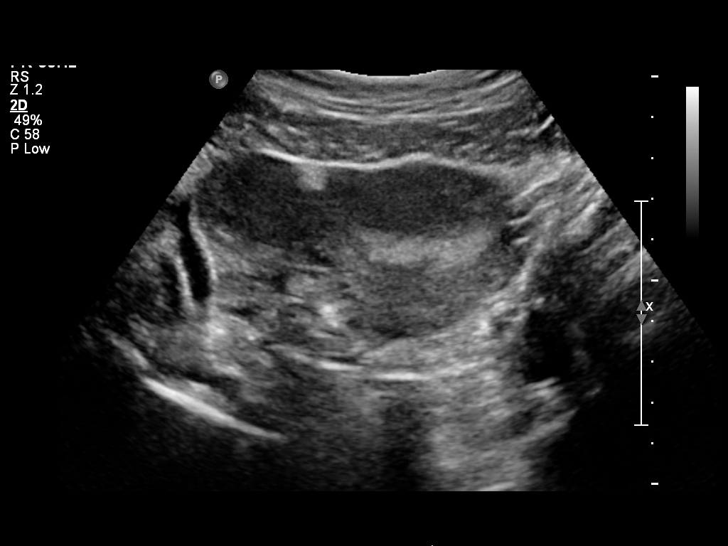
[im 12/44]
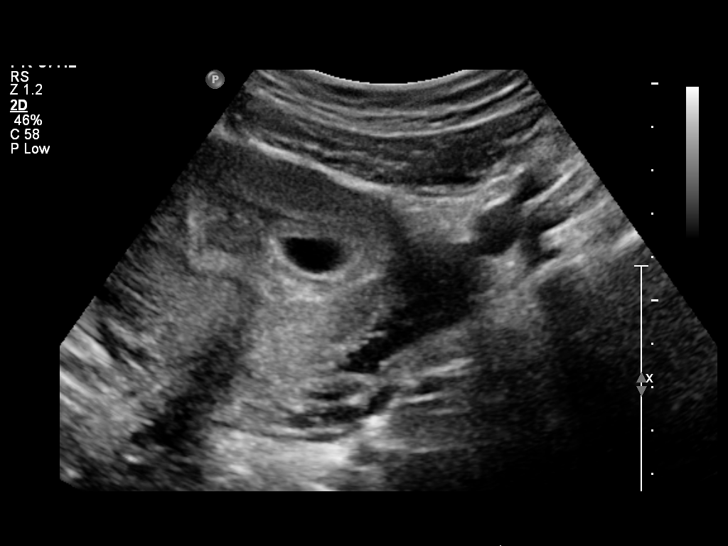
[im 15/44]
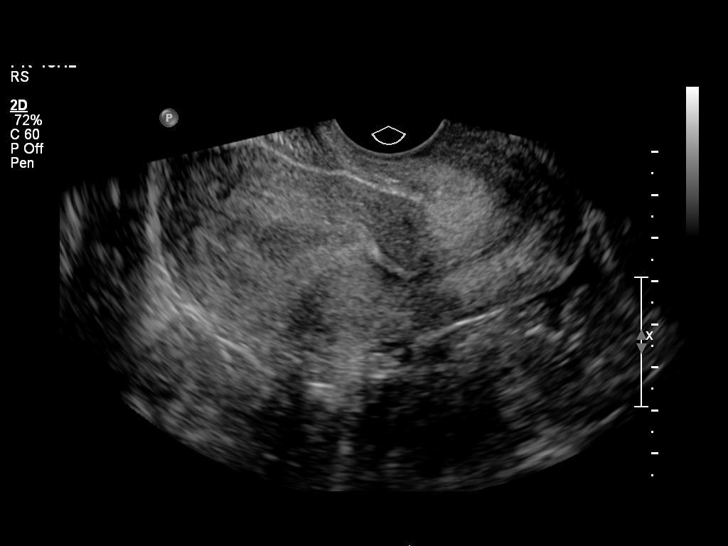
[im 18/44]
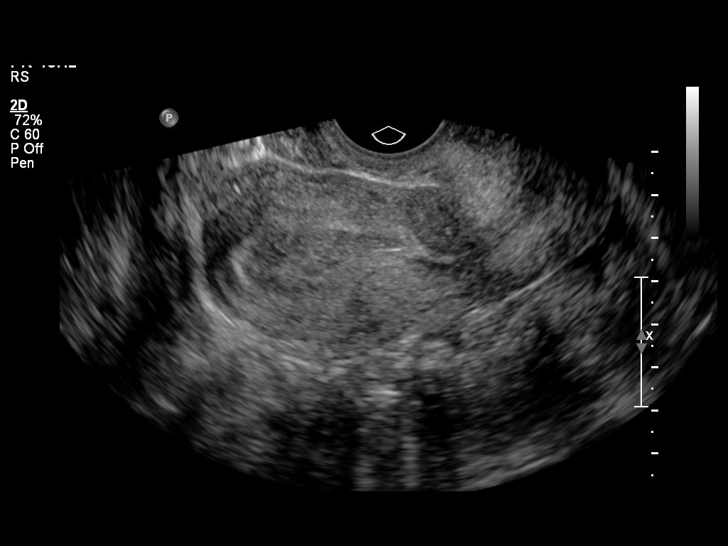
[im 21/44]
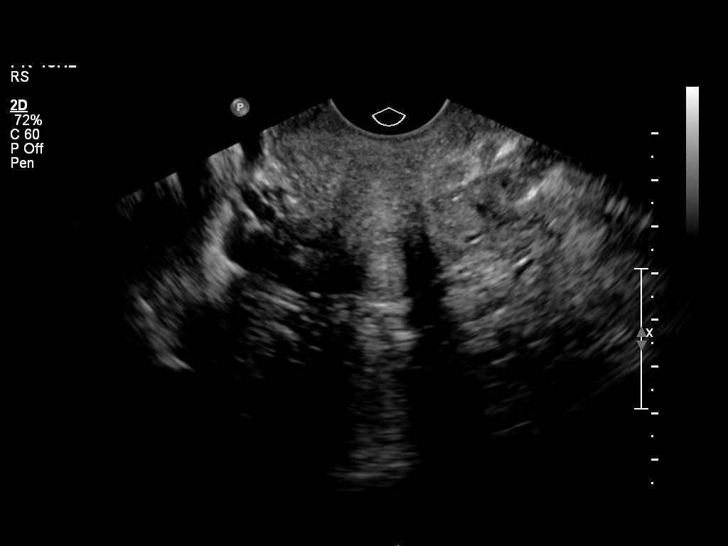
[im 24/44]
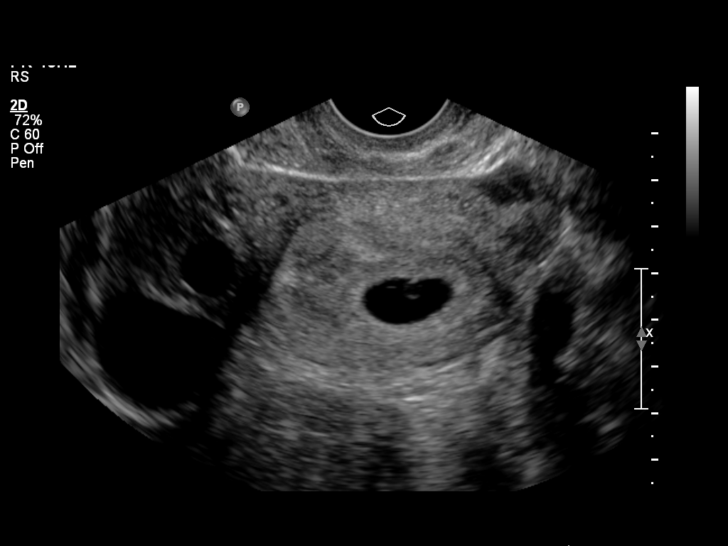
[im 28/44]
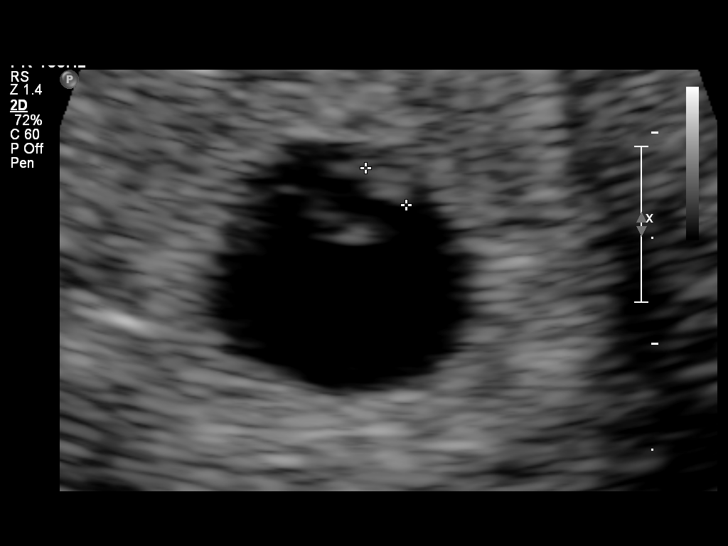
[im 31/44]
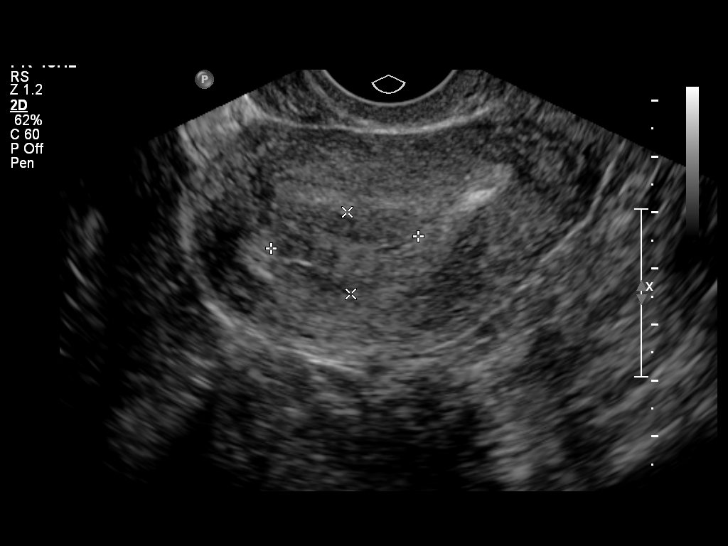
[im 34/44]
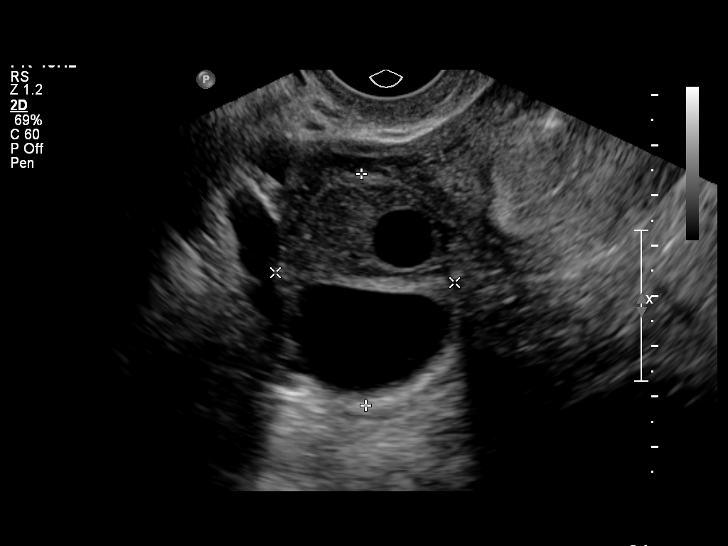
[im 37/44]
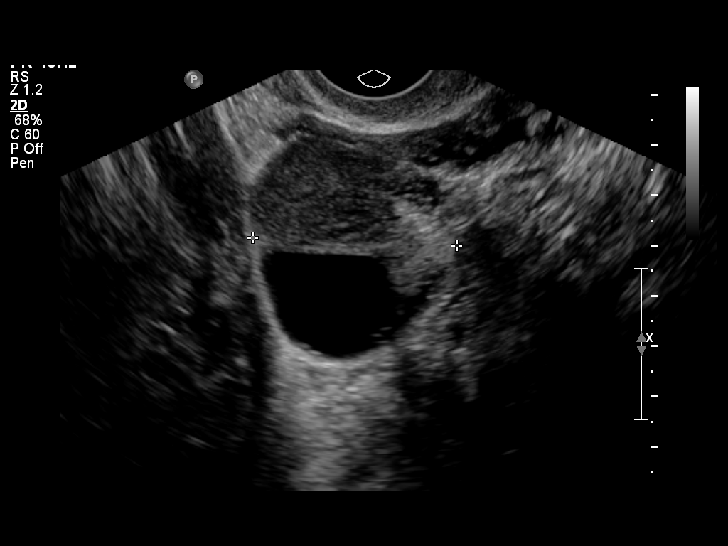
[im 40/44]
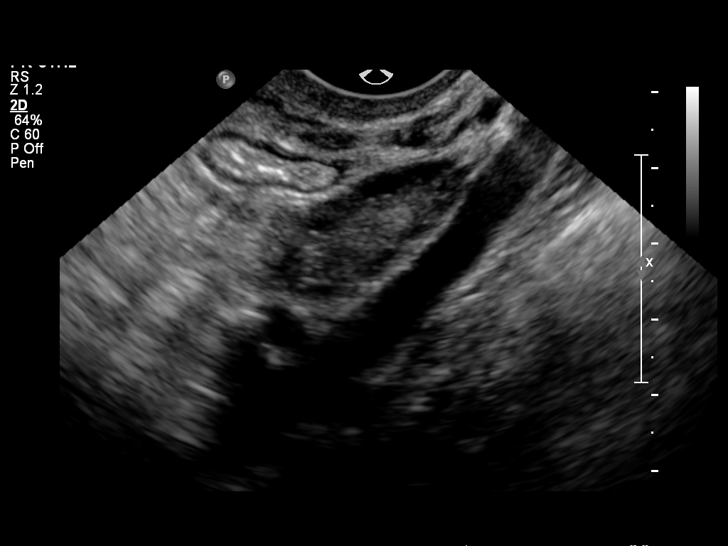
[im 44/44]
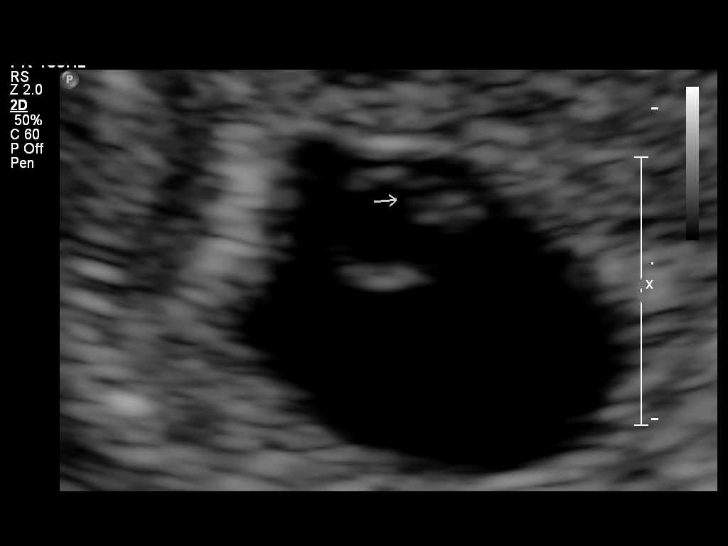

[14 of 28 positions shown; findings below may reference images not displayed]

Intrauterine gestational sac:  A single gestational sac is present.
Yolk sac: Present
Embryo: Present
Cardiac Activity: Present
Heart Rate: 102 bpm

CRL: 2.6  mm  5    w  6 d           US EDC: 08/06/2012

Maternal uterus/adnexae:
A subchorionic hemorrhage measures 2.6 1.4 x 1.4 cm.  This covers
less than one third of the circumference of the gestational sac.
No free fluid is present.  A corpus luteal cyst is evident within
the right ovary.  The adnexa are otherwise unremarkable.
IMPRESSION: 1.  Single intrauterine pregnancy with an estimated gestational age
of 5 weeks and 6 days.
2.  Fetal heart rate of 102 beats per minute is within normal
limits for age.
3.  Subchorionic hemorrhage covers less than on the third of the
circumference of the gestational sac.

## 2014-04-22 ENCOUNTER — Other Ambulatory Visit: Payer: Self-pay | Admitting: Family Medicine

## 2014-04-22 ENCOUNTER — Other Ambulatory Visit (HOSPITAL_COMMUNITY)
Admission: RE | Admit: 2014-04-22 | Discharge: 2014-04-22 | Disposition: A | Discharge status: Home / Self Care | Payer: 59 | Source: Ambulatory Visit | Attending: Family Medicine | Admitting: Family Medicine

## 2014-04-22 DIAGNOSIS — N926 Irregular menstruation, unspecified: Secondary | ICD-10-CM | POA: Insufficient documentation

## 2014-04-22 DIAGNOSIS — N898 Other specified noninflammatory disorders of vagina: Secondary | ICD-10-CM | POA: Insufficient documentation

## 2014-04-23 LAB — CYTOLOGY - PAP

## 2014-05-31 IMAGING — US US OB DETAIL+14 WK
2 of 3 series · 12 of 28 positions shown · non-contrast
Comparison: none

[Series 1: us ob detail +14 wk · 2 of 9 slices shown (1 of 2)]
[im 1/9]
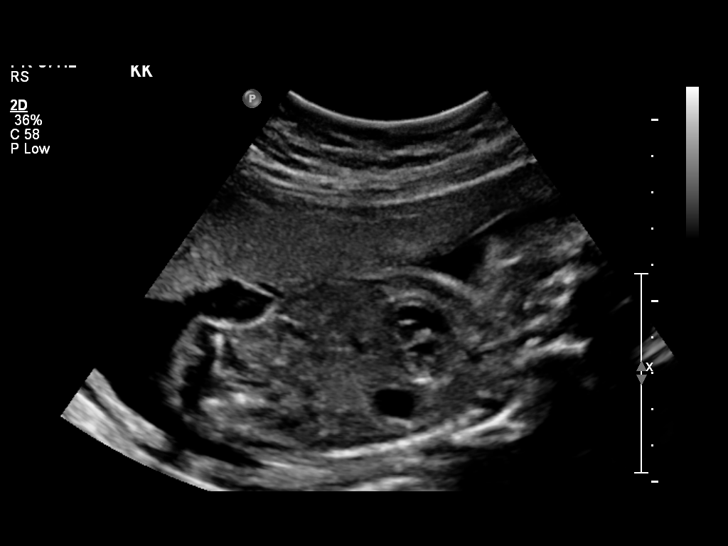
[im 9/9]
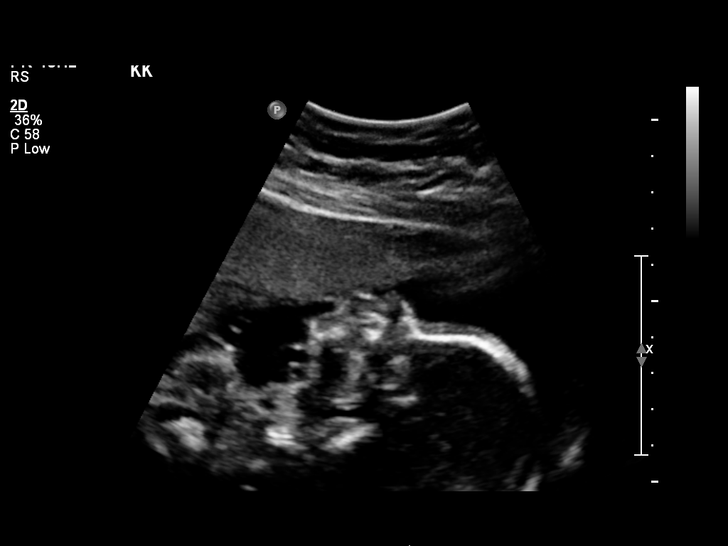

[Series 1: us ob detail +14 wk · 10 of 61 slices shown (2 of 2)]
[im 3/61]
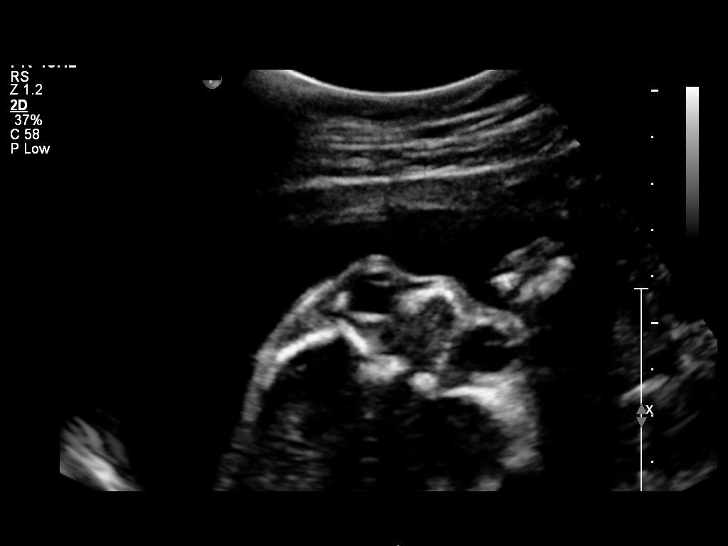
[im 11/61]
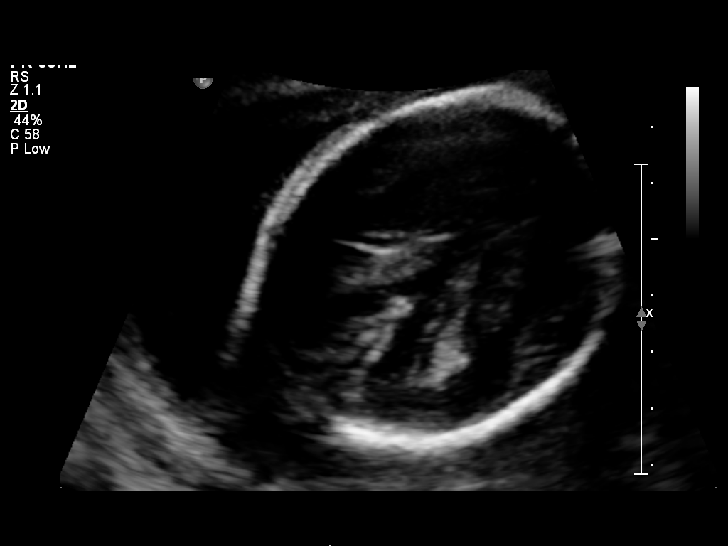
[im 16/61]
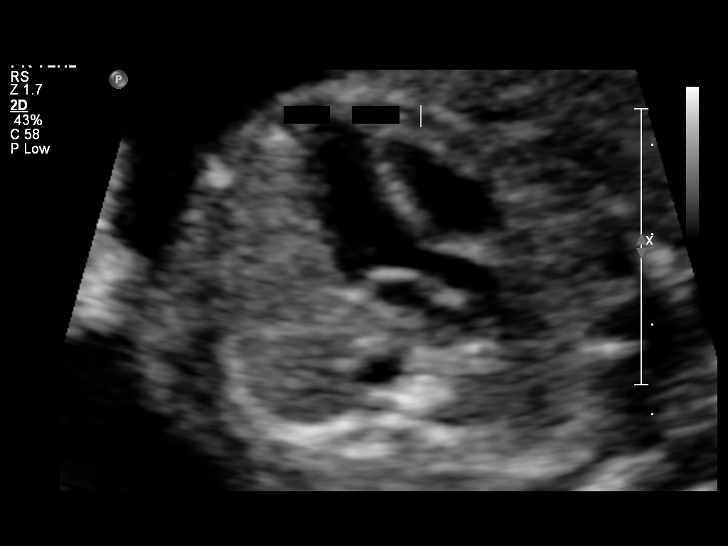
[im 21/61]
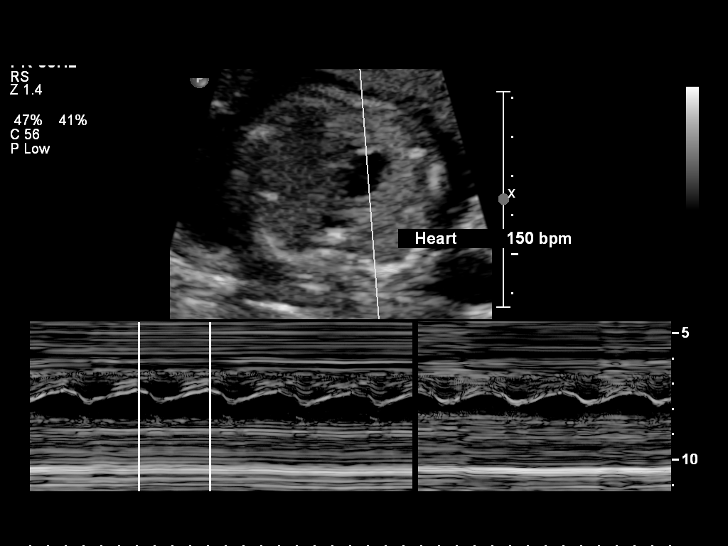
[im 29/61]
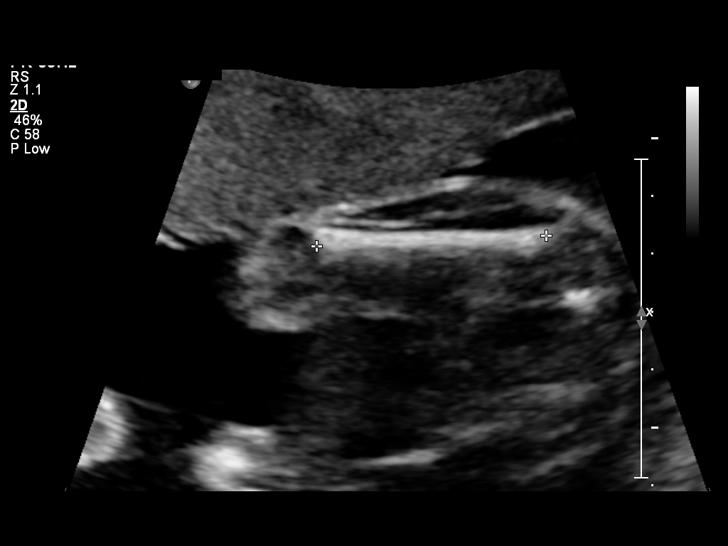
[im 34/61]
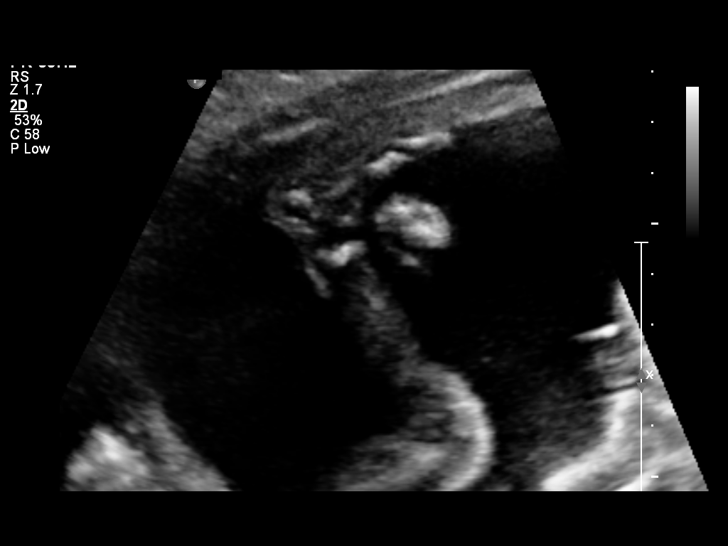
[im 40/61]
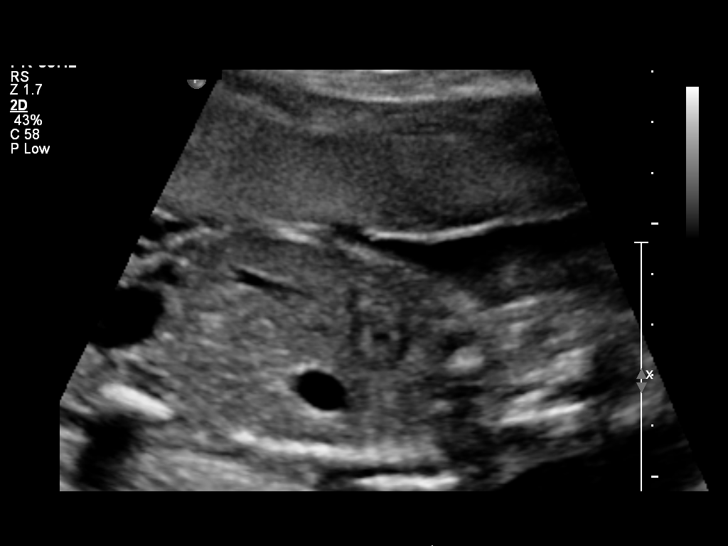
[im 47/61]
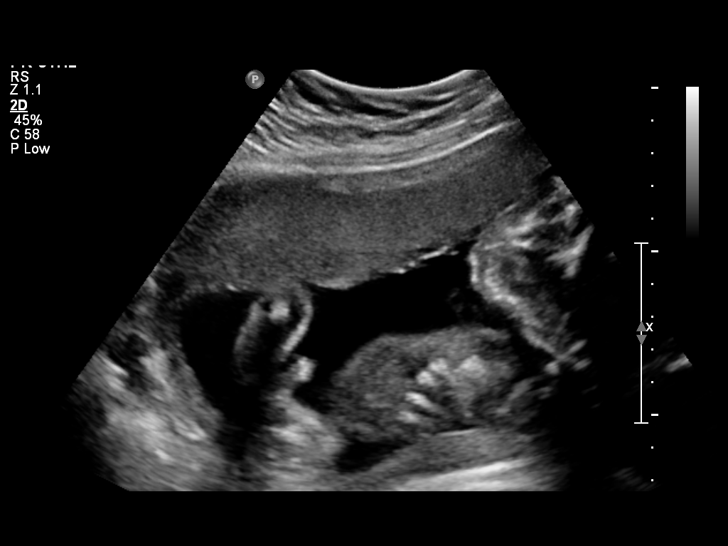
[im 53/61]
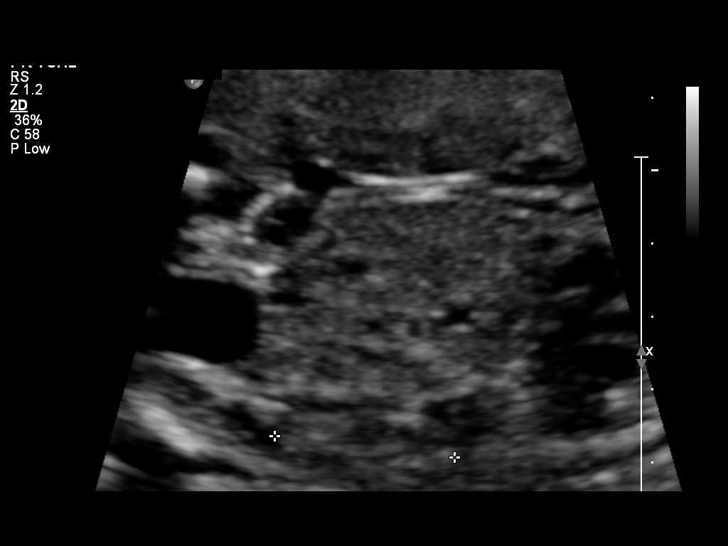
[im 58/61]
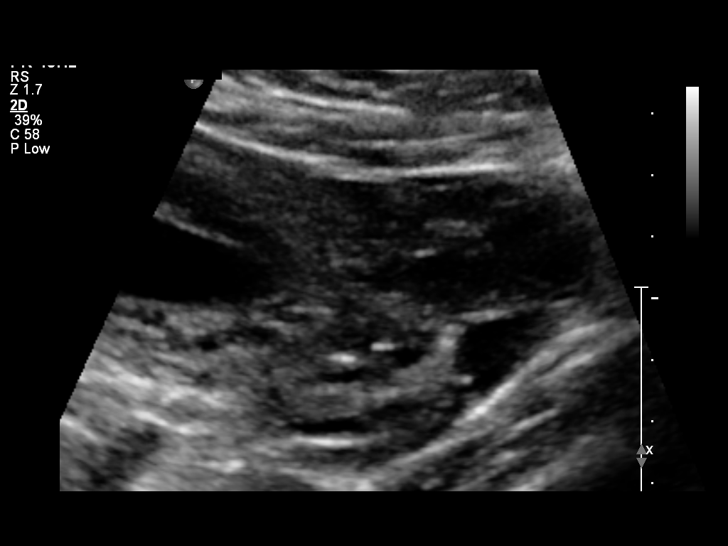

[12 of 28 positions shown; findings below may reference images not displayed]

OBSTETRICS REPORT
                      (Signed Final 04/05/2012 [DATE])

Service(s) Provided

 US OB DETAIL + 14 WK                                  76811.0
Indications

 Detailed fetal anatomic survey
Fetal Evaluation

 Num Of Fetuses:    1
 Preg. Location:    Intrauterine
 Fetal Heart Rate:  150                         bpm
 Cardiac Activity:  Observed
 Fetal Lie:         Intrauterine
 Presentation:      Cephalic
 Placenta:          Anterior, above cervical os
 P. Cord            Visualized, central
 Insertion:

 Amniotic Fluid
 AFI FV:      Subjectively within normal limits
                                             Larg Pckt:     5.7  cm
Biometry

 BPD:     56.6  mm    G. Age:   23w 2d                CI:         73.6   70 - 86
                                                      FL/HC:      18.8   18.4 -

 HC:     209.6  mm    G. Age:   23w 0d       62  %    HC/AC:      1.19   1.06 -

 AC:       176  mm    G. Age:   22w 4d       45  %    FL/BPD:     69.4   71 - 87
 FL:      39.3  mm    G. Age:   22w 4d       47  %    FL/AC:      22.3   20 - 24
 HUM:     37.9  mm    G. Age:   23w 2d       68  %
 CER:     25.1  mm    G. Age:   23w 1d       60  %

 Est. FW:     522  gm      1 lb 2 oz     53  %
Gestational Age

 LMP:           20w 4d       Date:   11/13/11                 EDD:   08/19/12
 U/S Today:     22w 6d                                        EDD:   08/03/12
 Best:          22w 3d    Det. By:   Early Ultrasound         EDD:   08/06/12
                                     (12/11/11)
Anatomy
 Cranium:          Appears normal         Aortic Arch:      Appears normal
 Fetal Cavum:      Appears normal         Ductal Arch:      Appears normal
 Ventricles:       Appears normal         Diaphragm:        Appears normal
 Choroid Plexus:   Appears normal         Stomach:          Appears normal
 Cerebellum:       Appears normal         Abdomen:          Appears normal
 Posterior Fossa:  Appears normal         Abdominal Wall:   Appears nml (cord
                                                            insert, abd wall)
 Nuchal Fold:      Not applicable (>20    Cord Vessels:     Appears normal (3
                   wks GA)                                  vessel cord)
 Face:             Appears normal         Kidneys:          Appear normal
                   (orbits and profile)
 Lips:             Appears normal         Bladder:          Appears normal
 Heart:            Appears normal         Spine:            Not well visualized
                   (4CH, axis, and
                   situs)
 RVOT:             Appears normal         Lower             Appears normal
                                          Extremities:
 LVOT:             Appears normal         Upper             Appears normal
                                          Extremities:

 Other:  Fetus appears to be a male. Heels visualized.
Targeted Anatomy

 Fetal Central Nervous System
 Lat. Ventricles:  7.4                    Cisterna Magna:
Cervix Uterus Adnexa

 Cervical Length:   3.68      cm

 Cervix:       Normal appearance by transabdominal scan.
 Uterus:       No abnormality visualized.
 Cul De Sac:   No free fluid seen.
 Left Ovary:   Size(cm) L: 2.9 x W: 1.57 x H: 1.81  Volume(cc):
               Within normal limits.
 Right Ovary:  Size(cm) L: 2.06 x W: 1.83 x H: 0.87  Volume(cc):
               Within normal limits.
 Adnexa:     No abnormality visualized.
Impression

 Single living intrauterine pregnancy in cephalic presentation.
 The estimated gestational age is 22w 3d based on Early
 Ultrasound  (12/11/11). No fetal anomalies are identified.
 There has been appropriate growth since prior ultrasound.

 questions or concerns.

## 2015-04-24 ENCOUNTER — Other Ambulatory Visit (HOSPITAL_COMMUNITY)
Admission: RE | Admit: 2015-04-24 | Discharge: 2015-04-24 | Disposition: A | Discharge status: Home / Self Care | Payer: BLUE CROSS/BLUE SHIELD | Source: Ambulatory Visit | Attending: Obstetrics and Gynecology | Admitting: Obstetrics and Gynecology

## 2015-04-24 ENCOUNTER — Other Ambulatory Visit: Payer: Self-pay | Admitting: Obstetrics and Gynecology

## 2015-04-24 DIAGNOSIS — Z01419 Encounter for gynecological examination (general) (routine) without abnormal findings: Secondary | ICD-10-CM | POA: Diagnosis present

## 2015-04-25 LAB — CYTOLOGY - PAP

## 2018-10-13 ENCOUNTER — Encounter (HOSPITAL_COMMUNITY): Payer: Self-pay | Admitting: Emergency Medicine

## 2018-10-13 ENCOUNTER — Emergency Department (HOSPITAL_COMMUNITY): Payer: Medicaid Other

## 2018-10-13 ENCOUNTER — Other Ambulatory Visit: Payer: Self-pay

## 2018-10-13 ENCOUNTER — Emergency Department (HOSPITAL_COMMUNITY)
Admission: EM | Admit: 2018-10-13 | Discharge: 2018-10-13 | Disposition: A | Discharge status: Home / Self Care | Payer: Medicaid Other | Attending: Emergency Medicine | Admitting: Emergency Medicine

## 2018-10-13 DIAGNOSIS — Z7722 Contact with and (suspected) exposure to environmental tobacco smoke (acute) (chronic): Secondary | ICD-10-CM | POA: Insufficient documentation

## 2018-10-13 DIAGNOSIS — J45909 Unspecified asthma, uncomplicated: Secondary | ICD-10-CM | POA: Diagnosis not present

## 2018-10-13 DIAGNOSIS — R519 Headache, unspecified: Secondary | ICD-10-CM

## 2018-10-13 DIAGNOSIS — R51 Headache: Secondary | ICD-10-CM | POA: Diagnosis not present

## 2018-10-13 HISTORY — DX: Migraine, unspecified, not intractable, without status migrainosus: G43.909

## 2018-10-13 MED ORDER — SODIUM CHLORIDE 0.9 % IV BOLUS
1000.0000 mL | Freq: Once | INTRAVENOUS | Status: AC
  Administered 2018-10-13: 1000 mL via INTRAVENOUS

## 2018-10-13 MED ORDER — DIPHENHYDRAMINE HCL 50 MG/ML IJ SOLN
25.0000 mg | Freq: Once | INTRAMUSCULAR | Status: AC
  Administered 2018-10-13: 15:00:00 25 mg via INTRAVENOUS
  Filled 2018-10-13: qty 1

## 2018-10-13 MED ORDER — DEXAMETHASONE SODIUM PHOSPHATE 10 MG/ML IJ SOLN
10.0000 mg | Freq: Once | INTRAMUSCULAR | Status: AC
  Administered 2018-10-13: 15:00:00 10 mg via INTRAVENOUS
  Filled 2018-10-13: qty 1

## 2018-10-13 MED ORDER — METOCLOPRAMIDE HCL 5 MG/ML IJ SOLN
10.0000 mg | Freq: Once | INTRAMUSCULAR | Status: AC
  Administered 2018-10-13: 15:00:00 10 mg via INTRAVENOUS
  Filled 2018-10-13: qty 2

## 2018-10-13 NOTE — ED Triage Notes (Signed)
Pt reports headaches started on Saturday. Reports has been recurrent and was dx with migraines yesterday at [facility] and prescribed medications. Reports today when worked out felt dizzy and then started having headache with blurred vision so around 1130 am took medication. Went back to [facility] today and vomited once there and was referred to ED for further workup.

## 2018-10-13 NOTE — Discharge Instructions (Addendum)
Your head CT is reassuring today shows no evidence of bleeding or other acute abnormality.  I am glad your headache is improved.  Please follow-up with neurology for further evaluation of these headaches, they are likely migraines.  Return if any of the below scenarios occur.  Get help right away if: Your migraine headache gets very bad. Your migraine headache lasts longer than 72 hours. You have a fever. You have a stiff neck. You have trouble seeing. Your muscles feel weak or like you cannot control them. You start to lose your balance a lot. You start to have trouble walking. You pass out (faint). You have a seizure.

## 2018-10-13 NOTE — ED Provider Notes (Addendum)
[facility] DEPT Provider Note   CSN: [id number] Arrival date & time: 10/13/18  1341     History   Chief Complaint Chief Complaint  Patient presents with  . Headache    HPI Michelle Faulkner is a 28 y.o. female.     Michelle Faulkner is a 28 y.o. female with a history of asthma, anxiety and depression, and recently diagnosed migraines, who presents to the ED for evaluation of severe headache.  Patient reports that she has been having intermittent headaches over the past week, headaches initially started last weekend when she had a headache that started on the left temple and eventually became more generalized.  Described as a constant throbbing pain.  Headache went away the following day after using over-the-counter medications.  She then had 2 days where she was headache free and then on Thursday headache return, again starting on the left side, throbbing in nature, she went to [facility] walk-in clinic, and was diagnosed with migraines, given a shot of Toradol which significantly improved her headache, she reports that when she left the clinic yesterday she was feeling much better, they prescribed her sumatriptan.  Today while at work she developed a headache again at about 11:30 AM, this time headache was much more severe and sudden in onset, she tried to work through the headache, but developed some intermittent blurred vision, vision is currently normal, although she reports light sensitivity.  She reports that she became nauseated, went back to the [facility] and had one episode of emesis there.  Given change in nature of headache with severity and sudden onset they sent her to the ED for further evaluation.  She does report that she tried taking the sumatriptan for this headache but that worsened her symptoms.  She denies any numbness, weakness, facial asymmetry or changes in speech.  No fevers or neck stiffness.  No other aggravating or alleviating factors.      Past Medical History:  Diagnosis Date  . Anemia   . Anxiety   . Asthma   . Depression   . Migraine     Patient Active Problem List   Diagnosis Date Noted  . Nexplanon in place- 09/08/2012 09/08/2012  . Mental disorders of mother, antepartum 07/11/2012  . Asthma exacerbation, mild 04/06/2012  . Anxiety attack 04/06/2012  . Supervision of low-risk pregnancy 04/06/2012    Past Surgical History:  Procedure Laterality Date  . WISDOM TOOTH EXTRACTION       OB History    Gravida  2   Para  2   Term  2   Preterm      AB      Living  2     SAB      TAB      Ectopic      Multiple      Live Births  2            Home Medications    Prior to Admission medications   Medication Sig Start Date End Date Taking? Authorizing Provider  albuterol (PROVENTIL HFA;VENTOLIN HFA) 108 (90 BASE) MCG/ACT inhaler Inhale 2 puffs into the lungs every 6 (six) hours as needed. For shortness of breath    [provider]  docusate sodium (COLACE) 100 MG capsule Take 100 mg by mouth daily.    [provider]  Prenatal Vit-Fe Fumarate-FA (PRENATAL MULTIVITAMIN) TABS Take 1 tablet by mouth daily.    [provider]    [facility]  History Family History  Problem Relation Age of Onset  . Asthma Mother   . Diabetes Mother   . Anxiety disorder Mother   . Ulcers Sister   . Other Neg Hx     Social History Social History   Tobacco Use  . Smoking status: Passive Smoke Exposure - Never Smoker  . Smokeless tobacco: Never Used  Substance Use Topics  . Alcohol use: No    Alcohol/week: 1.0 standard drinks    Types: 1 Shots of liquor per week  . Drug use: Yes    Types: Marijuana    Comment: everyday      Allergies   Patient has no known allergies.   Review of Systems Review of Systems  Constitutional: Negative for chills and fever.  Eyes: Positive for photophobia.  Gastrointestinal: Positive for nausea and vomiting.  Musculoskeletal: Negative for neck  pain and neck stiffness.  Neurological: Positive for headaches. Negative for dizziness, syncope, facial asymmetry, speech difficulty, weakness, light-headedness and numbness.  All other systems reviewed and are negative.    Physical Exam Updated Vital Signs BP 102/86   Pulse 84   Temp 98.7 F (37.1 C) (Oral)   Resp 17   Ht 5\' 3"  (1.6 m)   Wt 99.8 kg   SpO2 98%   BMI 38.97 kg/m   Physical Exam Vitals signs and nursing note reviewed.  Constitutional:      General: She is not in acute distress.    Appearance: She is well-developed and normal weight. She is not ill-appearing or diaphoretic.  HENT:     Head: Normocephalic and atraumatic.  Eyes:     General:        Right eye: No discharge.        Left eye: No discharge.     Extraocular Movements: Extraocular movements intact.     Right eye: No nystagmus.     Left eye: No nystagmus.     Pupils: Pupils are equal, round, and reactive to light.  Neck:     Musculoskeletal: Normal range of motion and neck supple. No neck rigidity.     Meningeal: Brudzinski's sign and Kernig's sign absent.  Cardiovascular:     Rate and Rhythm: Normal rate and regular rhythm.     Heart sounds: Normal heart sounds.  Pulmonary:     Effort: Pulmonary effort is normal. No respiratory distress.     Breath sounds: Normal breath sounds. No wheezing or rales.  Abdominal:     General: Bowel sounds are normal. There is no distension.     Palpations: Abdomen is soft. There is no mass.     Tenderness: There is no abdominal tenderness. There is no guarding.  Musculoskeletal:        General: No deformity.  Skin:    General: Skin is warm and dry.     Capillary Refill: Capillary refill takes less than 2 seconds.  Neurological:     Mental Status: She is alert.     Coordination: Coordination normal.     Comments: Speech is clear, able to follow commands CN III-XII intact Normal strength in upper and lower extremities bilaterally including dorsiflexion and  plantar flexion, strong and equal grip strength Sensation normal to light and sharp touch Moves extremities without ataxia, coordination intact Normal finger to nose and rapid alternating movements No pronator drift  Psychiatric:        Mood and Affect: Mood normal.        Behavior: Behavior normal.  ED Treatments / Results  Labs (all labs ordered are listed, but only abnormal results are displayed) Labs Reviewed - No data to display  EKG None  Radiology Ct Head Wo Contrast  Result Date: 10/13/2018 CLINICAL DATA:  Headache. EXAM: CT HEAD WITHOUT CONTRAST TECHNIQUE: Contiguous axial images were obtained from the base of the skull through the vertex without intravenous contrast. COMPARISON:  None. FINDINGS: Brain: No evidence of acute infarction, hemorrhage, hydrocephalus, extra-axial collection or mass lesion/mass effect. Vascular: No hyperdense vessel or unexpected calcification. Skull: Normal. Negative for fracture or focal lesion. Sinuses/Orbits: No acute finding. Other: None. IMPRESSION: Normal head CT. Electronically Signed   By: Lupita RaiderJames  Green Jr M.D.   On: 10/13/2018 15:36    Procedures Procedures (including critical care time)  Medications Ordered in ED Medications  sodium chloride 0.9 % bolus 1,000 mL (1,000 mLs Intravenous New Bag/Given 10/13/18 1501)  metoCLOPramide (REGLAN) injection 10 mg (10 mg Intravenous Given 10/13/18 1455)  diphenhydrAMINE (BENADRYL) injection 25 mg (25 mg Intravenous Given 10/13/18 1455)  dexamethasone (DECADRON) injection 10 mg (10 mg Intravenous Given 10/13/18 1455)     Initial Impression / Assessment and Plan / ED Course  I have reviewed the triage vital signs and the nursing notes.  Pertinent labs & imaging results that were available during my care of the patient were reviewed by me and considered in my medical decision making (see chart for details).  28 year old female presents to the ED for evaluation of severe sudden onset headache  which began at 1130 today.  Has been having headaches intermittently throughout the week, and [facility], but today headache was more severe and more [place] other she has had previously, she had some blurred vision, light sensitivity and associated vomiting.  On arrival she has normal neurologic exam which is reassuring, but given sudden onset of headache certainly there is concern for possible subarachnoid hemorrhage, we are within 6-hour window, will get noncontrast head CT, if this is normal this makes this very unlikely, suspect that this could certainly also be a migraine headache as patient has been recently diagnosed.  No meningeal signs or fever to suggest meningitis.  Will give headache cocktail and reevaluate.  CT head with no evidence of subarachnoid hemorrhage or other acute findings.  After IV headache cocktail patient's symptoms have completely resolved and she is feeling much better.  Will discharge home with outpatient neurology follow-up.  Return precautions discussed.  Patient expresses understanding and agreement with plan.  Discharged home in good condition.  Final Clinical Impressions(s) / ED Diagnoses   Final diagnoses:  Bad headache    ED Discharge Orders    None       Dartha LodgeFord, Raniya Golembeski N, [place]PA-C 10/16/18 1809    Dartha LodgeFord, Mehlani Blankenburg N, PA-C 10/23/18 [id number]    Raeford RazorKohut, Stephen, MD 10/23/18 2106

## 2018-10-13 NOTE — ED Triage Notes (Signed)
Per [facility] pt from [facility] walk in clinic for headache for a couple days. Was given Toradol yesterday at office.  Vitals: 130/100, 70Hr,

## 2020-02-16 NOTE — L&D Delivery Note (Signed)
Delivery Note Labor onset: 01/14/2021  Labor Onset Time: 1755 Complete dilation at 12:03 AM  Onset of pushing at 1203 FHR second stage Cat 2 Analgesia/Anesthesia intrapartum: epidural  Guided pushing with maternal urge. Delivery of a viable female "Michelle Faulkner" at [facility]. Fetal head delivered in ROA position.  Nuchal cord: N/A.  Infant placed on maternal abd, dried, and tactile stim. Spontaneous cry. Cord double clamped after pulsation ceased and cut by RN.  2 Rns present for birth.  Cord blood sample collected: yes Arterial cord blood sample collected: N/A  Placenta delivered Tomasa Blase via Binnie Kand Maneuver, intact, with 3 VC.  AMTSL Placenta to L&D. Uterine tone firm u/2, bleeding minimal  No laceration identified.  Anesthesia: epidural Repair: N/A QBL/EBL (mL): 150 Complications: N/A APGAR: APGAR (1 MIN): 8   APGAR (5 MINS): 9   APGAR (10 MINS):   Mom to postpartum.  Baby to Couplet care / Skin to Skin.  Gerhard Munch Audrey Thull MSN, CNM 01/21/2021, 12:38 AM

## 2020-05-23 LAB — HEPATITIS C ANTIBODY: HCV Ab: NEGATIVE

## 2020-05-23 LAB — OB RESULTS CONSOLE HIV ANTIBODY (ROUTINE TESTING): HIV: NONREACTIVE

## 2020-05-23 LAB — OB RESULTS CONSOLE HEPATITIS B SURFACE ANTIGEN: Hepatitis B Surface Ag: NEGATIVE

## 2020-05-23 LAB — OB RESULTS CONSOLE RUBELLA ANTIBODY, IGM: Rubella: IMMUNE

## 2020-05-23 LAB — OB RESULTS CONSOLE ABO/RH: RH Type: POSITIVE

## 2020-06-06 LAB — OB RESULTS CONSOLE GC/CHLAMYDIA
Chlamydia: NEGATIVE
Gonorrhea: NEGATIVE

## 2020-06-09 ENCOUNTER — Encounter (HOSPITAL_COMMUNITY): Payer: Self-pay | Admitting: Obstetrics and Gynecology

## 2020-06-09 ENCOUNTER — Inpatient Hospital Stay (HOSPITAL_COMMUNITY)
Admission: AD | Admit: 2020-06-09 | Discharge: 2020-06-09 | Disposition: A | Discharge status: Home / Self Care | Payer: Medicaid Other | Attending: Women's Health | Admitting: Women's Health

## 2020-06-09 ENCOUNTER — Other Ambulatory Visit: Payer: Self-pay

## 2020-06-09 DIAGNOSIS — T887XXA Unspecified adverse effect of drug or medicament, initial encounter: Secondary | ICD-10-CM

## 2020-06-09 DIAGNOSIS — O219 Vomiting of pregnancy, unspecified: Secondary | ICD-10-CM | POA: Diagnosis not present

## 2020-06-09 DIAGNOSIS — Z7722 Contact with and (suspected) exposure to environmental tobacco smoke (acute) (chronic): Secondary | ICD-10-CM | POA: Insufficient documentation

## 2020-06-09 DIAGNOSIS — Z3A08 8 weeks gestation of pregnancy: Secondary | ICD-10-CM | POA: Diagnosis not present

## 2020-06-09 DIAGNOSIS — O210 Mild hyperemesis gravidarum: Secondary | ICD-10-CM | POA: Insufficient documentation

## 2020-06-09 LAB — CBC
HCT: 36.1 % (ref 36.0–46.0)
Hemoglobin: 12.2 g/dL (ref 12.0–15.0)
MCH: 30.2 pg (ref 26.0–34.0)
MCHC: 33.8 g/dL (ref 30.0–36.0)
MCV: 89.4 fL (ref 80.0–100.0)
Platelets: 405 10*3/uL — ABNORMAL HIGH (ref 150–400)
RBC: 4.04 MIL/uL (ref 3.87–5.11)
RDW: 14 % (ref 11.5–15.5)
WBC: 6.3 10*3/uL (ref 4.0–10.5)
nRBC: 0 % (ref 0.0–0.2)

## 2020-06-09 LAB — COMPREHENSIVE METABOLIC PANEL
ALT: 14 U/L (ref 0–44)
AST: 20 U/L (ref 15–41)
Albumin: 3.4 g/dL — ABNORMAL LOW (ref 3.5–5.0)
Alkaline Phosphatase: 39 U/L (ref 38–126)
Anion gap: 10 (ref 5–15)
BUN: 8 mg/dL (ref 6–20)
CO2: 21 mmol/L — ABNORMAL LOW (ref 22–32)
Calcium: 9.3 mg/dL (ref 8.9–10.3)
Chloride: 101 mmol/L (ref 98–111)
Creatinine, Ser: 0.59 mg/dL (ref 0.44–1.00)
GFR, Estimated: >60 mL/min (ref >60)
Glucose, Bld: 80 mg/dL (ref 70–99)
Potassium: 4 mmol/L (ref 3.5–5.1)
Sodium: 132 mmol/L — ABNORMAL LOW (ref 135–145)
Total Bilirubin: 0.6 mg/dL (ref 0.3–1.2)
Total Protein: 7.2 g/dL (ref 6.5–8.1)

## 2020-06-09 LAB — URINALYSIS, ROUTINE W REFLEX MICROSCOPIC
Bilirubin Urine: NEGATIVE
Glucose, UA: NEGATIVE mg/dL
Ketones, ur: 20 mg/dL — AB
Leukocytes,Ua: NEGATIVE
Nitrite: NEGATIVE
Protein, ur: NEGATIVE mg/dL
Specific Gravity, Urine: 1.018 (ref 1.005–1.030)
pH: 8 (ref 5.0–8.0)

## 2020-06-09 MED ORDER — VITAMIN B-6 50 MG PO TABS: 50.0000 mg | ORAL_TABLET | Freq: Every day | ORAL | Status: DC

## 2020-06-09 MED ORDER — LACTATED RINGERS IV BOLUS
1000.0000 mL | Freq: Once | INTRAVENOUS | Status: AC
  Administered 2020-06-09: 1000 mL via INTRAVENOUS

## 2020-06-09 MED ORDER — VITAMIN B-6 50 MG PO TABS
50.0000 mg | ORAL_TABLET | Freq: Every day | ORAL | Status: DC
  Administered 2020-06-09: 50 mg via ORAL
  Filled 2020-06-09: qty 1

## 2020-06-09 MED ORDER — VITAMIN B-6 100 MG PO TABS
100.0000 mg | ORAL_TABLET | Freq: Every day | ORAL | Status: DC
  Filled 2020-06-09: qty 1

## 2020-06-09 NOTE — Discharge Instructions (Signed)
Safe Medications in Pregnancy    Acne: Benzoyl Peroxide Salicylic Acid  Backache/Headache: Tylenol: 2 regular strength every 4 hours OR              2 Extra strength every 6 hours  Colds/Coughs/Allergies: Benadryl (alcohol free) 25 mg every 6 hours as needed Breath right strips Claritin Cepacol throat lozenges Chloraseptic throat spray Cold-Eeze- up to three times per day Cough drops, alcohol free Flonase (by prescription only) Guaifenesin Mucinex Robitussin DM (plain only, alcohol free) Saline nasal spray/drops Sudafed (pseudoephedrine) & Actifed ** use only after [redacted] weeks gestation and if you do not have high blood pressure Tylenol Vicks Vaporub Zinc lozenges Zyrtec   Constipation: Colace Ducolax suppositories Fleet enema Glycerin suppositories Metamucil Milk of magnesia Miralax Senokot Smooth move tea  Diarrhea: Kaopectate Imodium A-D  *NO pepto Bismol  Hemorrhoids: Anusol Anusol HC Preparation H Tucks  Indigestion: Tums Maalox Mylanta Zantac  Pepcid  Insomnia: Benadryl (alcohol free) 25mg  every 6 hours as needed Tylenol PM Unisom, no Gelcaps  Leg Cramps: Tums MagGel  Nausea/Vomiting:  Bonine Dramamine Emetrol Ginger extract Sea bands Meclizine  Nausea medication to take during pregnancy:  Unisom (doxylamine succinate 25 mg tablets) Take one tablet daily at bedtime. If symptoms are not adequately controlled, the dose can be increased to a maximum recommended dose of two tablets daily (1/2 tablet in the morning, 1/2 tablet mid-afternoon and one at bedtime). Vitamin B6 100mg  tablets. Take one tablet twice a day (up to 200 mg per day).  Skin Rashes: Aveeno products Benadryl cream or 25mg  every 6 hours as needed Calamine Lotion 1% cortisone cream  Yeast infection: Gyne-lotrimin 7 Monistat 7   **If taking multiple medications, please check labels to avoid duplicating the same active ingredients **take  medication as directed on the label ** Do not exceed 4000 mg of tylenol in 24 hours **Do not take medications that contain aspirin or ibuprofen           Morning Sickness  Morning sickness is when a woman feels nauseous during pregnancy. This nauseous feeling may or may not come with vomiting. It often occurs in the morning, but it can be a problem at any time of day. Morning sickness is most common during the first trimester. In some cases, it may continue throughout pregnancy. Although morning sickness is unpleasant, it is usually harmless unless the woman develops severe and continual vomiting (hyperemesis gravidarum), a condition that requires more intense treatment. What are the causes? The exact cause of this condition is not known, but it seems to be related to normal hormonal changes that occur in pregnancy. What increases the risk? You are more likely to develop this condition if:  You experienced nausea or vomiting before your pregnancy.  You had morning sickness during a previous pregnancy.  You are pregnant with more than one baby, such as twins. What are the signs or symptoms? Symptoms of this condition include:  Nausea.  Vomiting. How is this diagnosed? This condition is usually diagnosed based on your signs and symptoms. How is this treated? In many cases, treatment is not needed for this condition. Making some changes to what you eat may help to control symptoms. Your health care provider may also prescribe or recommend:  Vitamin B6 supplements.  Anti-nausea medicines.  Ginger. Follow these instructions at home: Medicines  Take over-the-counter and prescription medicines only as told by your health care provider. Do not use any prescription, over-the-counter, or herbal medicines for morning sickness without first  talking with your health care provider.  Take multivitamins before getting pregnant. This can prevent or decrease the severity of morning  sickness in most women. Eating and drinking  Eat a piece of dry toast or crackers before getting out of bed in the morning.  Eat 5 or 6 small meals a day.  Eat dry and bland foods, such as rice or a baked potato. Foods that are high in carbohydrates are often helpful.  Avoid greasy, fatty, and spicy foods.  Have someone cook for you if the smell of any food causes nausea and vomiting.  If you feel nauseous after taking prenatal vitamins, take the vitamins at night or with a snack.  Eat a protein snack between meals if you are hungry. Nuts, yogurt, and cheese are good options.  Drink fluids throughout the day.  Try ginger ale made with real ginger, ginger tea made from fresh grated ginger, or ginger candies. General instructions  Do not use any products that contain nicotine or tobacco. These products include cigarettes, chewing tobacco, and vaping devices, such as e-cigarettes. If you need help quitting, ask your health care provider.  Get an air purifier to keep the air in your house free of odors.  Get plenty of fresh air.  Try to avoid odors that trigger your nausea.  Consider trying these methods to help relieve symptoms: ? Wearing an acupressure wristband. These wristbands are often worn for seasickness. ? Acupuncture. Contact a health care provider if:  Your home remedies are not working and you need medicine.  You feel dizzy or light-headed.  You are losing weight. Get help right away if:  You have persistent and uncontrolled nausea and vomiting.  You faint.  You have severe pain in your abdomen. Summary  Morning sickness is when a woman feels nauseous during pregnancy. This nauseous feeling may or may not come with vomiting.  Morning sickness is most common during the first trimester.  It often occurs in the morning, but it can be a problem at any time of day.  In many cases, treatment is not needed for this condition. Making some changes to what you eat  may help to control symptoms. This information is not intended to replace advice given to you by your health care provider. Make sure you discuss any questions you have with your health care provider. Document Revised: 09/17/2019 Document Reviewed: 08/27/2019 Elsevier Patient Education  2021 [employer]        Hyperemesis Gravidarum Hyperemesis gravidarum is a severe form of nausea and vomiting that happens during pregnancy. Hyperemesis is worse than morning sickness. It may cause you to have nausea or vomiting all day for many days. It may keep you from eating and drinking enough food and liquids, which can lead to dehydration, malnutrition, and weight loss. Hyperemesis usually occurs during the first half (the first 20 weeks) of pregnancy. It often goes away once a woman is in her second half of pregnancy. However, sometimes hyperemesis continues through an entire pregnancy. What are the causes? The cause of this condition is not known. It may be associated with:  Changes in hormones in the body during pregnancy.  Changes in the gastrointestinal system.  Genetic or inherited conditions. What are the signs or symptoms? Symptoms of this condition include:  Severe nausea and vomiting that does not go away.  Problems keeping food down.  Weight loss.  Loss of body fluid (dehydration).  Loss of appetite. You may have no desire to eat or  you may not like the food you have previously enjoyed. How is this diagnosed? This condition may be diagnosed based on your medical history, your symptoms, and a physical exam. You may also have other tests, including:  Blood tests.  Urine tests.  Blood pressure tests.  Ultrasound to look for problems with the placenta or to check if you are pregnant with more than one baby. How is this treated? This condition is managed by controlling symptoms. This may include:  Following an eating plan. This can help to lessen nausea and  vomiting.  Treatments that do not use medicine. These include acupressure bracelets, hypnosis, and eating or drinking foods or fluids that contain ginger, ginger ale, or ginger tea.  Taking prescription medicine or over-the-counter medicine as told by your health care provider.  Continuing to take prenatal vitamins. You may need to change what kind you take and when you take them. Follow your health care provider's instructions about prenatal vitamins. An eating plan and medicines are often used together to help control symptoms. If medicines do not help relieve nausea and vomiting, you may need to receive fluids through an IV at the hospital. Follow these instructions at home: To help relieve your symptoms, listen to your body. Everyone is different and has different preferences. Find what works best for you. Here are some things you can try to help relieve your symptoms: Meals and snacks  Eat 5-6 small meals daily instead of 3 large meals. Eating small meals and snacks can help you avoid an empty stomach.  Before getting out of bed, eat a couple of crackers to avoid moving around on an empty stomach.  Eat a protein-rich snack before bed. Examples include cheese and crackers, or a peanut butter sandwich made with 1 slice of whole-wheat bread and 1 tsp (5 g) of peanut butter.  Eat and drink slowly.  Try eating starchy foods as these are usually tolerated well. Examples include cereal, toast, bread, potatoes, pasta, rice, and pretzels.  Eat at least one serving of protein with your meals and snacks. Protein options include lean meats, poultry, seafood, beans, nuts, nut butters, eggs, cheese, and yogurt.  Eat or suck on things that have ginger in them. It may help to relieve nausea. Add  tsp (0.44 g) ground ginger to hot tea, or choose ginger tea.   Fluids It is important to stay hydrated. Try to:  Drink small amounts of fluids often.  Drink fluids 30 minutes before or after a meal to  help lessen the feeling of a full stomach.  Drink 100% fruit juice or an electrolyte drink. An electrolyte drink contains sodium, potassium, and chloride.  Drink fluids that are cold, clear, and carbonated or sour. These include lemonade, ginger ale, lemon-lime soda, ice water, and sparkling water. Things to avoid Avoid the following:  Eating foods that trigger your symptoms. These may include spicy foods, coffee, high-fat foods, very sweet foods, and acidic foods.  Drinking more than 1 cup of fluid at a time.  Skipping meals. Nausea can be more intense on an empty stomach. If you cannot tolerate food, do not force it. Try sucking on ice chips or other frozen items and make up for missed calories later.  Lying down within 2 hours after eating.  Being exposed to environmental triggers. These may include food smells, smoky rooms, closed spaces, rooms with strong smells, warm or humid places, overly loud and noisy rooms, and rooms with motion or flickering lights. Try eating meals  in a well-ventilated area that is free of strong smells.  Making quick and sudden changes in your movement.  Taking iron pills and multivitamins that contain iron. If you take prescription iron pills, do not stop taking them unless your health care provider approves.  Preparing food. The smell of food can spoil your appetite or trigger nausea. General instructions  Brush your teeth or use a mouth rinse after meals.  Take over-the-counter and prescription medicines only as told by your health care provider.  Follow instructions from your health care provider about eating or drinking restrictions.  Talk with your health care provider about starting a supplement of vitamin B6.  Continue to take your prenatal vitamins as told by your health care provider. If you are having trouble taking your prenatal vitamins, talk with your health care provider about other options.  Keep all follow-up visits. This is  important. Follow-up visits include prenatal visits. Contact a health care provider if:  You have pain in your abdomen.  You have a severe headache.  You have vision problems.  You are losing weight.  You feel weak or dizzy.  You cannot eat or drink without vomiting, especially if this goes on for a full day. Get help right away if:  You cannot drink fluids without vomiting.  You vomit blood.  You have constant nausea and vomiting.  You are very weak.  You faint.  You have a fever and your symptoms suddenly get worse. Summary  Hyperemesis gravidarum is a severe form of nausea and vomiting that happens during pregnancy.  Making some changes to your eating habits may help relieve nausea and vomiting.  This condition may be managed with lifestyle changes and medicines as prescribed by your health care provider.  If medicines do not help relieve nausea and vomiting, you may need to receive fluids through an IV at the hospital. This information is not intended to replace advice given to you by your health care provider. Make sure you discuss any questions you have with your health care provider. Document Revised: 08/27/2019 Document Reviewed: 08/27/2019 Elsevier Patient Education  2021 [employer].

## 2020-06-09 NOTE — MAU Provider Note (Signed)
History     CSN: [id number]  Arrival date and time: 06/09/20 4098   Event Date/Time   First Provider Initiated Contact with Patient 06/09/20 1237      Chief Complaint  Patient presents with  . Nausea  . Emesis   Ms. Michelle Faulkner is a 30 y.o. G3P2002 at [redacted]w[redacted]d who presents to MAU for nausea and vomiting. Patient reports the nausea and vomiting started about 3 days ago on Friday 06/06/2020. Patient reports prior to this she was only experiencing mild morning sickness without vomiting. Patient reports she started Zoloft on the same day that her nausea and vomiting increased. Patient reports her nausea worsened after this date, but she is only vomiting once in 24 hours. Patient reports she called her OB and was told to take Emetrol, Unisom and Vitamin B6, which helped. Patient reports she has only vomited once in the past 24 hours and is able to eat and drink without vomiting immediately afterwards. Patient reports that she did not like taking the Unisom because it made her sleepy.  Pt denies VB, LOF, ctx, decreased FM, vaginal discharge/odor/itching. Pt denies abdominal pain, constipation, diarrhea, or urinary problems. Pt denies fever, chills, fatigue, sweating or changes in appetite. Pt denies SOB or chest pain. Pt denies dizziness, HA, light-headedness, weakness.  Allergies? NKDA Current medications/supplements? Emetrol, Unisom, Vitamin B6   OB History    Gravida  3   Para  2   Term  2   Preterm      AB      Living  2     SAB      IAB      Ectopic      Multiple      Live Births  2           Past Medical History:  Diagnosis Date  . Anemia   . Anxiety   . Asthma   . Depression   . Migraine     Past Surgical History:  Procedure Laterality Date  . WISDOM TOOTH EXTRACTION      Family History  Problem Relation Age of Onset  . Asthma Mother   . Diabetes Mother   . Anxiety disorder Mother   . Ulcers Sister   . Other Neg Hx     Social History    Tobacco Use  . Smoking status: Passive Smoke Exposure - Never Smoker  . Smokeless tobacco: Never Used  Substance Use Topics  . Alcohol use: No    Alcohol/week: 1.0 standard drink    Types: 1 Shots of liquor per week  . Drug use: Yes    Types: Marijuana    Comment: everyday     Allergies: No Known Allergies  Medications Prior to Admission  Medication Sig Dispense Refill Last Dose  . sertraline (ZOLOFT) 25 MG tablet Take 25 mg by mouth daily.   06/08/2020 at Unknown time  . vitamin B-6 (PYRIDOXINE) 25 MG tablet Take 25 mg by mouth daily.   Past Week at Unknown time  . albuterol (PROVENTIL HFA;VENTOLIN HFA) 108 (90 BASE) MCG/ACT inhaler Inhale 2 puffs into the lungs every 6 (six) hours as needed. For shortness of breath   Unknown at Unknown time  . docusate sodium (COLACE) 100 MG capsule Take 100 mg by mouth daily.     . Prenatal Vit-Fe Fumarate-FA (PRENATAL MULTIVITAMIN) TABS Take 1 tablet by mouth daily.       Review of Systems  Constitutional: Negative for chills, diaphoresis, fatigue and  fever.  Eyes: Negative for visual disturbance.  Respiratory: Negative for shortness of breath.   Cardiovascular: Negative for chest pain.  Gastrointestinal: Positive for nausea and vomiting. Negative for abdominal pain, constipation and diarrhea.  Genitourinary: Negative for dysuria, flank pain, frequency, pelvic pain, urgency, vaginal bleeding and vaginal discharge.  Neurological: Negative for dizziness, weakness, light-headedness and headaches.   Physical Exam   Blood pressure 109/66, pulse 76, temperature 98.8 F (37.1 C), temperature source Oral, resp. rate 16, height 5\' 3"  (1.6 m), weight 92.3 kg, last menstrual period 04/08/2020, SpO2 100 %, currently breastfeeding.  Patient Vitals for the past 24 hrs:  BP Temp Temp src Pulse Resp SpO2 Height Weight  06/09/20 1004 109/66 98.8 F (37.1 C) Oral 76 16 100 % -- --  06/09/20 0959 -- -- -- -- -- -- 5\' 3"  (1.6 m) 92.3 kg   Physical  Exam Vitals and nursing note reviewed.  Constitutional:      Appearance: Normal appearance.  HENT:     Head: Normocephalic and atraumatic.  Pulmonary:     Effort: Pulmonary effort is normal.  Neurological:     Mental Status: She is alert and oriented to person, place, and time.  Psychiatric:        Mood and Affect: Mood normal.        Behavior: Behavior normal.        Thought Content: Thought content normal.        Judgment: Judgment normal.    Results for orders placed or performed during the hospital encounter of 06/09/20 (from the past 24 hour(s))  Urinalysis, Routine w reflex microscopic Urine, Clean Catch     Status: Abnormal   Collection Time: 06/09/20 10:35 AM  Result Value Ref Range   Color, Urine YELLOW YELLOW   APPearance HAZY (A) CLEAR   Specific Gravity, Urine 1.018 1.005 - 1.030   pH 8.0 5.0 - 8.0   Glucose, UA NEGATIVE NEGATIVE mg/dL   Hgb urine dipstick MODERATE (A) NEGATIVE   Bilirubin Urine NEGATIVE NEGATIVE   Ketones, ur 20 (A) NEGATIVE mg/dL   Protein, ur NEGATIVE NEGATIVE mg/dL   Nitrite NEGATIVE NEGATIVE   Leukocytes,Ua NEGATIVE NEGATIVE   RBC / HPF 11-20 0 - 5 RBC/hpf   WBC, UA 0-5 0 - 5 WBC/hpf   Bacteria, UA RARE (A) NONE SEEN   Squamous Epithelial / LPF 6-10 0 - 5   Mucus PRESENT   CBC     Status: Abnormal   Collection Time: 06/09/20  1:09 PM  Result Value Ref Range   WBC 6.3 4.0 - 10.5 K/uL   RBC 4.04 3.87 - 5.11 MIL/uL   Hemoglobin 12.2 12.0 - 15.0 g/dL   HCT 06/11/20 06/11/20 - 09.8 %   MCV 89.4 80.0 - 100.0 fL   MCH 30.2 26.0 - 34.0 pg   MCHC 33.8 30.0 - 36.0 g/dL   RDW 11.9 14.7 - 82.9 %   Platelets 405 (H) 150 - 400 K/uL   nRBC 0.0 0.0 - 0.2 %  Comprehensive metabolic panel     Status: Abnormal   Collection Time: 06/09/20  1:09 PM  Result Value Ref Range   Sodium 132 (L) 135 - 145 mmol/L   Potassium 4.0 3.5 - 5.1 mmol/L   Chloride 101 98 - 111 mmol/L   CO2 21 (L) 22 - 32 mmol/L   Glucose, Bld 80 70 - 99 mg/dL   BUN 8 6 - 20 mg/dL    Creatinine, Ser 13.0 0.44 - 1.00  mg/dL   Calcium 9.3 8.9 - 35.3 mg/dL   Total Protein 7.2 6.5 - 8.1 g/dL   Albumin 3.4 (L) 3.5 - 5.0 g/dL   AST 20 15 - 41 U/L   ALT 14 0 - 44 U/L   Alkaline Phosphatase 39 38 - 126 U/L   Total Bilirubin 0.6 0.3 - 1.2 mg/dL   GFR, Estimated >29 >92 mL/min   Anion gap 10 5 - 15   No results found.  MAU Course  Procedures  MDM -N/V in pregnancy -weight today 92.3kg -UA: hazy/mod hgb/20ketones/rare bacteria, sending urine for culture -CBC w/Diff: platelets 405 -CMP: no abnormalities requiring treatment -1L LR + 50mg  VitB6 given given, pt reports NV now resolved -pt able to urinate after fluids -PO challenge successful -pt discharged to home in stable condition  Orders Placed This Encounter  Procedures  . Culture, OB Urine    Standing Status:   Standing    Number of Occurrences:   1  . Urinalysis, Routine w reflex microscopic Urine, Clean Catch    Standing Status:   Standing    Number of Occurrences:   1  . CBC    Standing Status:   Standing    Number of Occurrences:   1  . Comprehensive metabolic panel    Standing Status:   Standing    Number of Occurrences:   1  . Insert peripheral IV    Standing Status:   Standing    Number of Occurrences:   1  . Discharge patient    Order Specific Question:   Discharge disposition    Answer:   01-Home or Self Care [1]    Order Specific Question:   Discharge patient date    Answer:   06/09/2020   Meds ordered this encounter  Medications  . lactated ringers bolus 1,000 mL  . DISCONTD: pyridOXINE (VITAMIN B-6) tablet 100 mg  . DISCONTD: pyridOXINE (VITAMIN B-6) tablet 50 mg  . pyridOXINE (VITAMIN B-6) tablet 50 mg   Assessment and Plan   1. Nausea and vomiting in pregnancy   2. [redacted] weeks gestation of pregnancy   3. Medication side effect     Allergies as of 06/09/2020   No Known Allergies     Medication List    TAKE these medications   albuterol 108 (90 Base) MCG/ACT inhaler Commonly  known as: VENTOLIN HFA Inhale 2 puffs into the lungs every 6 (six) hours as needed. For shortness of breath   docusate sodium 100 MG capsule Commonly known as: COLACE Take 100 mg by mouth daily.   prenatal multivitamin Tabs tablet Take 1 tablet by mouth daily.   sertraline 25 MG tablet Commonly known as: ZOLOFT Take 25 mg by mouth daily.   vitamin B-6 25 MG tablet Commonly known as: pyridOXINE Take 25 mg by mouth daily.       -will call with culture results, if positive -discussed N/V in pregnancy vs. Side effect of Zoloft -safe meds in pregnancy list given -pt advised to take medications around the clock and not to stop taking if feeling better -pt to continue with Unisom and B6 at home PRN -discussed nonpharmacologic and pharmacologic treatments of N/V -discussed normal expectations for N/V in pregnancy -pt discharged to home in stable condition   06/11/2020 E Reynald Woods 06/09/2020, 3:05 PM

## 2020-06-09 NOTE — MAU Note (Signed)
Michelle Faulkner is a 30 y.o. at [redacted]w[redacted]d here in MAU reporting: since Friday she has been feeling really sick. Recently started Zoloft so she is unsure if that is related. Emesis x 1 in the past 24 hours.   LMP: 04/08/20  Onset of complaint: since Friday  Pain score: 0/10  Vitals:   06/09/20 1004  BP: 109/66  Pulse: 76  Resp: 16  Temp: 98.8 F (37.1 C)  SpO2: 100%     Lab orders placed from triage: UA

## 2020-06-09 NOTE — Progress Notes (Addendum)
Patient stated that she feels so much better after the bag of fluid and crackers. Patient was able to tolerate crackers and Medication with no n/v. N. Nugent, NP made aware. No new orders at this time.

## 2020-06-10 LAB — CULTURE, OB URINE

## 2020-08-29 ENCOUNTER — Inpatient Hospital Stay (HOSPITAL_COMMUNITY)
Admission: AD | Admit: 2020-08-29 | Discharge: 2020-08-29 | Disposition: A | Discharge status: Home / Self Care | Payer: Medicaid Other | Attending: Obstetrics and Gynecology | Admitting: Obstetrics and Gynecology

## 2020-08-29 ENCOUNTER — Other Ambulatory Visit: Payer: Self-pay

## 2020-08-29 DIAGNOSIS — U071 COVID-19: Secondary | ICD-10-CM | POA: Insufficient documentation

## 2020-08-29 DIAGNOSIS — Z3A00 Weeks of gestation of pregnancy not specified: Secondary | ICD-10-CM | POA: Diagnosis not present

## 2020-08-29 DIAGNOSIS — Z3A20 20 weeks gestation of pregnancy: Secondary | ICD-10-CM | POA: Insufficient documentation

## 2020-08-29 DIAGNOSIS — O98519 Other viral diseases complicating pregnancy, unspecified trimester: Secondary | ICD-10-CM | POA: Diagnosis not present

## 2020-08-29 DIAGNOSIS — O98512 Other viral diseases complicating pregnancy, second trimester: Secondary | ICD-10-CM | POA: Diagnosis not present

## 2020-08-29 DIAGNOSIS — Z8709 Personal history of other diseases of the respiratory system: Secondary | ICD-10-CM | POA: Insufficient documentation

## 2020-08-29 NOTE — MAU Note (Signed)
SAYS- SAT  STARTED FEELING - SORE THROAT, N/V , H/A, BODY ACHE Monday- TEMP 100.0 ?  ON Monday - POSITIVE HOME COVID TEST [facility] [place] Monday- CALLED DR - TOLD TO TAKE MUCINEX, TYLENOL, UNISOM , B6, -  ON Tuesday - CALLED IN MED FOR COVID  ALSO USES NEBULIZER - BC SOB YESTERDAY- LIGHT HEADED , COUGHING, OUT OF BREATH,  O2 SAT IN TRIAGE - 99

## 2020-08-29 NOTE — MAU Provider Note (Signed)
None     S Ms. Michelle Faulkner is a 30 y.o. [id number] patient who presents to MAU today with concerns with recent Covid diagnosis.  Patient states she was diagnosed on Monday and started taking Paxlovid on Tuesday.  Patient states that since then she has not been treating her symptoms and feels she has been improving.  However, today she reports having bouts of SOB at home, but they have since subsided. She endorses a history of asthma and reports her symptoms improve with nebulizer/inhaler usage.  She states "I don't feel bad," but wanted to make sure her O2 levels were good and that baby was fine.   O BP 106/73 (BP Location: Right Arm)   Pulse 94   Temp 98.4 F (36.9 C)   Resp 18   Ht 5\' 3"  (1.6 m)   Wt 93.9 kg   LMP 04/08/2020   SpO2 99%   BMI 36.69 kg/m  Physical Exam Constitutional:      General: She is not in acute distress.    Appearance: She is well-developed. She is not ill-appearing or toxic-appearing.  HENT:     Head: Normocephalic and atraumatic.  Cardiovascular:     Rate and Rhythm: Normal rate and regular rhythm.  Pulmonary:     Effort: Pulmonary effort is normal. No respiratory distress.     Breath sounds: Normal breath sounds. No decreased breath sounds, wheezing, rhonchi or rales.  Musculoskeletal:        General: Normal range of motion.  Skin:    General: Skin is warm and dry.  Neurological:     Mental Status: She is alert.  Psychiatric:        Mood and Affect: Mood normal.        Behavior: Behavior normal.    A Medical screening exam complete Covid-19 Stable  P -Instructed to continue to treat symptoms as they arise. -Precautions given regarding increasing SOB and when to return to MAU. -Instructed to quarantine per CDC guidelines.  -Encouraged to call primary ob or return to MAU if symptoms worsen. -Patient without questions or concerns. -Discharged to home in stable condition.   04/10/2020, CNM 08/29/2020 10:43 PM

## 2020-10-30 LAB — OB RESULTS CONSOLE RPR: RPR: NONREACTIVE

## 2020-12-15 LAB — OB RESULTS CONSOLE GBS: GBS: NEGATIVE

## 2021-01-15 ENCOUNTER — Encounter (HOSPITAL_COMMUNITY): Payer: Self-pay | Admitting: Obstetrics and Gynecology

## 2021-01-15 ENCOUNTER — Other Ambulatory Visit: Payer: Self-pay

## 2021-01-15 ENCOUNTER — Inpatient Hospital Stay (HOSPITAL_COMMUNITY)
Admission: AD | Admit: 2021-01-15 | Discharge: 2021-01-15 | Disposition: A | Discharge status: Home / Self Care | Payer: Medicaid Other | Attending: Obstetrics and Gynecology | Admitting: Obstetrics and Gynecology

## 2021-01-15 DIAGNOSIS — O479 False labor, unspecified: Secondary | ICD-10-CM | POA: Diagnosis not present

## 2021-01-15 DIAGNOSIS — Z3A40 40 weeks gestation of pregnancy: Secondary | ICD-10-CM | POA: Insufficient documentation

## 2021-01-15 DIAGNOSIS — O471 False labor at or after 37 completed weeks of gestation: Secondary | ICD-10-CM | POA: Insufficient documentation

## 2021-01-15 NOTE — MAU Note (Signed)
...  Michelle Faulkner is a 30 y.o. at [redacted]w[redacted]d here in MAU reporting: CTX every 5 mins with a pain rating of 5/10 since 1155pm yesterday. Pt denies LOF, VB, abnormal discharge, and DFM. Pt last intercourse was two day ago. Last SVE today in the office 2/70.  GBS Neg No current HSV outbreaks, last 2019  Pain score: 5/10 Vitals:   01/15/21 0053  BP: 127/85  Pulse: (!) 104  Resp: 20  Temp: 98.3 F (36.8 C)  SpO2: 100%     FHT:135 Lab orders placed from triage:

## 2021-01-15 NOTE — MAU Provider Note (Signed)
Chief Complaint:  Contractions   None     HPI: Michelle Faulkner is a 30 y.o. G3P2002 at [redacted]w[redacted]d who presents to maternity admissions reporting regular contractions. Contractions have become less frequent while pt is in MAU.   She reports good fetal movement.   HPI  Past Medical History: Past Medical History:  Diagnosis Date   Anemia    Anxiety    Asthma    Depression    Migraine     Past obstetric history: OB History  Gravida Para Term Preterm AB Living  3 2 2     2   SAB IAB Ectopic Multiple Live Births          2    # Outcome Date GA Lbr Len/2nd Weight Sex Delivery Anes PTL Lv  3 Current           2 Term 08/04/12 [redacted]w[redacted]d 04:00 / 00:13 2736 g M Vag-Spont None  LIV  1 Term 08/15/07 [redacted]w[redacted]d  3430 g M Vag-Spont EPI  LIV    Past Surgical History: Past Surgical History:  Procedure Laterality Date   WISDOM TOOTH EXTRACTION      Family History: Family History  Problem Relation Age of Onset   Asthma Mother    Diabetes Mother    Anxiety disorder Mother    Ulcers Sister    Other Neg Hx     Social History: Social History   Tobacco Use   Smoking status: Never    Passive exposure: Yes   Smokeless tobacco: Never  Vaping Use   Vaping Use: Never used  Substance Use Topics   Alcohol use: No    Alcohol/week: 1.0 standard drink    Types: 1 Shots of liquor per week   Drug use: Not Currently    Types: Marijuana    Comment: everyday     Allergies: No Known Allergies  Meds:  Medications Prior to Admission  Medication Sig Dispense Refill Last Dose   ferrous sulfate 325 (65 FE) MG EC tablet Take 325 mg by mouth 3 (three) times daily with meals.      Prenatal Vit-Fe Fumarate-FA (PRENATAL MULTIVITAMIN) TABS Take 1 tablet by mouth daily.   01/15/2021   valACYclovir (VALTREX) 1000 MG tablet Take 1,000 mg by mouth 2 (two) times daily.   01/15/2021   vitamin B-6 (PYRIDOXINE) 25 MG tablet Take 25 mg by mouth daily.   01/15/2021   albuterol (PROVENTIL HFA;VENTOLIN HFA) 108 (90 BASE)  MCG/ACT inhaler Inhale 2 puffs into the lungs every 6 (six) hours as needed. For shortness of breath   More than a month   docusate sodium (COLACE) 100 MG capsule Take 100 mg by mouth daily.      sertraline (ZOLOFT) 25 MG tablet Take 25 mg by mouth daily.       ROS:  Review of Systems   I have reviewed patient's Past Medical Hx, Surgical Hx, Family Hx, Social Hx, medications and allergies.   Physical Exam  Patient Vitals for the past 24 hrs:  BP Temp Temp src Pulse Resp SpO2 Height Weight  01/15/21 0218 124/82 -- -- 85 -- -- -- --  01/15/21 0215 -- -- -- -- -- 100 % -- --  01/15/21 0210 -- -- -- -- -- 99 % -- --  01/15/21 0205 -- -- -- -- -- 100 % -- --  01/15/21 0200 -- -- -- -- -- 100 % -- --  01/15/21 0155 -- -- -- -- -- 100 % -- --  01/15/21 0150 -- -- -- -- -- 99 % -- --  01/15/21 0145 -- -- -- -- -- 100 % -- --  01/15/21 0140 -- -- -- -- -- 100 % -- --  01/15/21 0137 108/68 -- -- 84 -- -- -- --  01/15/21 0135 -- -- -- -- -- 100 % -- --  01/15/21 0130 -- -- -- -- -- 100 % -- --  01/15/21 0125 -- -- -- -- -- 99 % -- --  01/15/21 0120 -- -- -- -- -- 100 % -- --  01/15/21 0115 -- -- -- -- -- 100 % -- --  01/15/21 0110 -- -- -- -- -- 100 % -- --  01/15/21 0105 -- -- -- -- -- 100 % -- --  01/15/21 0104 125/87 -- -- 99 -- -- -- --  01/15/21 0103 125/87 -- Oral 99 20 100 % -- --  01/15/21 0100 -- -- -- -- -- 100 % -- --  01/15/21 0055 -- -- -- -- -- 100 % -- --  01/15/21 0053 127/85 98.3 F (36.8 C) Oral (!) 104 20 100 % 5\' 3"  (1.6 m) 100.6 kg  01/15/21 0051 127/85 -- -- (!) 103 -- -- -- --  01/15/21 0050 -- -- -- -- -- 100 % -- --   Constitutional: Well-developed, well-nourished female in no acute distress.  Cardiovascular: normal rate Respiratory: normal effort GI: Abd soft, non-tender, gravid appropriate for gestational age.  MS: Extremities nontender, no edema, normal ROM Neurologic: Alert and oriented x 4.  GU: Neg CVAT.  PELVIC EXAM: Cervix pink, visually closed,  without lesion, scant white creamy discharge, vaginal walls and external genitalia normal Bimanual exam: Cervix 0/long/high, firm, anterior, neg CMT, uterus nontender, nonenlarged, adnexa without tenderness, enlargement, or mass  Dilation: 3 Effacement (%): 70 Cervical Position: Posterior Station: -3 Presentation: Vertex Exam by:: [email or web address], RN  FHT:  Baseline 135 , moderate variability, accelerations present, no decelerations Contractions: q 7-8 minutes   Labs: No results found for this or any previous visit (from the past 24 hour(s)).    Imaging:  No results found.  MAU Course/MDM: Orders Placed This Encounter  Procedures   Contraction - monitoring   External fetal heart monitoring   Vaginal exam   Discharge patient    No orders of the defined types were placed in this encounter.    NST reviewed and reactive No evidence of active labor with cervix unchanged in 1+ hours in MAU  Assessment: 1. False labor     Plan: Discharge home Labor precautions and fetal kick counts  Follow-up Information     [name], MD Follow up.   Specialty: Obstetrics and Gynecology Why: As scheduled Contact information: [address] [place] [phone]         [facility] Follow up.   Specialty: Obstetrics and Gynecology Why: For signs of labor or emergencies. Contact information: [address] [place] [phone]               Allergies as of 01/15/2021   No Known Allergies      Medication List     TAKE these medications    albuterol 108 (90 Base) MCG/ACT inhaler Commonly known as: VENTOLIN HFA Inhale 2 puffs into the lungs every 6 (six) hours as needed. For shortness of breath   docusate sodium 100 MG capsule Commonly known as: COLACE Take 100 mg by mouth daily.  ferrous sulfate 325 (65 FE) MG EC tablet Take 325 mg by mouth 3 (three) times daily  with meals.   prenatal multivitamin Tabs tablet Take 1 tablet by mouth daily.   sertraline 25 MG tablet Commonly known as: ZOLOFT Take 25 mg by mouth daily.   valACYclovir 1000 MG tablet Commonly known as: VALTREX Take 1,000 mg by mouth 2 (two) times daily.   vitamin B-6 25 MG tablet Commonly known as: pyridOXINE Take 25 mg by mouth daily.        Sharen Counter Certified Nurse-Midwife 01/15/2021 2:32 AM

## 2021-01-15 NOTE — Discharge Instructions (Signed)
Reasons to return to MAU at [facility]:  1.  Contractions are  5 minutes apart or less, each last 1 minute, these have been going on for 1-2 hours, and you cannot walk or talk during them 2.  You have a large gush of fluid, or a trickle of fluid that will not stop and you have to wear a pad 3.  You have bleeding that is bright red, heavier than spotting--like menstrual bleeding (spotting can be normal in early labor or after a check of your cervix) 4.  You do not feel the baby moving like he/she normally does  

## 2021-01-20 ENCOUNTER — Inpatient Hospital Stay (HOSPITAL_COMMUNITY)
Admission: AD | Admit: 2021-01-20 | Discharge: 2021-01-22 | DRG: 806 | Disposition: A | Discharge status: Home / Self Care | Payer: Medicaid Other | Attending: Obstetrics and Gynecology | Admitting: Obstetrics and Gynecology

## 2021-01-20 ENCOUNTER — Encounter (HOSPITAL_COMMUNITY): Payer: Self-pay | Admitting: Obstetrics and Gynecology

## 2021-01-20 ENCOUNTER — Inpatient Hospital Stay (HOSPITAL_COMMUNITY): Payer: Medicaid Other

## 2021-01-20 ENCOUNTER — Inpatient Hospital Stay (HOSPITAL_COMMUNITY)
Admission: AD | Admit: 2021-01-20 | Payer: Medicaid Other | Source: Home / Self Care | Admitting: Obstetrics and Gynecology

## 2021-01-20 ENCOUNTER — Inpatient Hospital Stay (HOSPITAL_COMMUNITY): Payer: Medicaid Other | Admitting: Anesthesiology

## 2021-01-20 ENCOUNTER — Other Ambulatory Visit: Payer: Self-pay

## 2021-01-20 DIAGNOSIS — O480 Post-term pregnancy: Secondary | ICD-10-CM | POA: Diagnosis present

## 2021-01-20 DIAGNOSIS — J45909 Unspecified asthma, uncomplicated: Secondary | ICD-10-CM | POA: Diagnosis present

## 2021-01-20 DIAGNOSIS — O9952 Diseases of the respiratory system complicating childbirth: Secondary | ICD-10-CM | POA: Diagnosis present

## 2021-01-20 DIAGNOSIS — A6000 Herpesviral infection of urogenital system, unspecified: Secondary | ICD-10-CM | POA: Diagnosis present

## 2021-01-20 DIAGNOSIS — Z3A41 41 weeks gestation of pregnancy: Secondary | ICD-10-CM

## 2021-01-20 DIAGNOSIS — O99214 Obesity complicating childbirth: Secondary | ICD-10-CM | POA: Diagnosis present

## 2021-01-20 DIAGNOSIS — O99344 Other mental disorders complicating childbirth: Secondary | ICD-10-CM | POA: Diagnosis present

## 2021-01-20 DIAGNOSIS — F419 Anxiety disorder, unspecified: Secondary | ICD-10-CM | POA: Diagnosis present

## 2021-01-20 DIAGNOSIS — F32A Depression, unspecified: Secondary | ICD-10-CM | POA: Diagnosis present

## 2021-01-20 DIAGNOSIS — Z20822 Contact with and (suspected) exposure to covid-19: Secondary | ICD-10-CM | POA: Diagnosis present

## 2021-01-20 DIAGNOSIS — O9832 Other infections with a predominantly sexual mode of transmission complicating childbirth: Secondary | ICD-10-CM | POA: Diagnosis present

## 2021-01-20 HISTORY — DX: Herpesviral infection, unspecified: B00.9

## 2021-01-20 HISTORY — DX: Unspecified abnormal cytological findings in specimens from vagina: R87.629

## 2021-01-20 LAB — TYPE AND SCREEN
ABO/RH(D): A POS
Antibody Screen: NEGATIVE

## 2021-01-20 LAB — CBC
HCT: 31 % — ABNORMAL LOW (ref 36.0–46.0)
Hemoglobin: 10.5 g/dL — ABNORMAL LOW (ref 12.0–15.0)
MCH: 30.3 pg (ref 26.0–34.0)
MCHC: 33.9 g/dL (ref 30.0–36.0)
MCV: 89.6 fL (ref 80.0–100.0)
Platelets: 155 10*3/uL (ref 150–400)
RBC: 3.46 MIL/uL — ABNORMAL LOW (ref 3.87–5.11)
RDW: 15.1 % (ref 11.5–15.5)
WBC: 7.7 10*3/uL (ref 4.0–10.5)
nRBC: 0 % (ref 0.0–0.2)

## 2021-01-20 LAB — RESP PANEL BY RT-PCR (FLU A&B, COVID) ARPGX2
Influenza A by PCR: NEGATIVE
Influenza B by PCR: NEGATIVE
SARS Coronavirus 2 by RT PCR: NEGATIVE

## 2021-01-20 LAB — RPR: RPR Ser Ql: NONREACTIVE

## 2021-01-20 MED ORDER — FENTANYL CITRATE (PF) 100 MCG/2ML IJ SOLN
50.0000 ug | INTRAMUSCULAR | Status: DC | PRN
  Filled 2021-01-20: qty 2

## 2021-01-20 MED ORDER — ACETAMINOPHEN 325 MG PO TABS: 650.0000 mg | ORAL_TABLET | ORAL | Status: DC | PRN

## 2021-01-20 MED ORDER — OXYCODONE-ACETAMINOPHEN 5-325 MG PO TABS: 2.0000 | ORAL_TABLET | ORAL | Status: DC | PRN

## 2021-01-20 MED ORDER — LACTATED RINGERS IV SOLN: INTRAVENOUS | Status: DC

## 2021-01-20 MED ORDER — EPHEDRINE 5 MG/ML INJ: 10.0000 mg | INTRAVENOUS | Status: DC | PRN

## 2021-01-20 MED ORDER — FENTANYL-BUPIVACAINE-NACL 0.5-0.125-0.9 MG/250ML-% EP SOLN
12.0000 mL/h | EPIDURAL | Status: DC | PRN
  Filled 2021-01-20: qty 250

## 2021-01-20 MED ORDER — TERBUTALINE SULFATE 1 MG/ML IJ SOLN: 0.2500 mg | Freq: Once | INTRAMUSCULAR | Status: DC | PRN

## 2021-01-20 MED ORDER — HYDROXYZINE HCL 50 MG PO TABS: 50.0000 mg | ORAL_TABLET | Freq: Four times a day (QID) | ORAL | Status: DC | PRN

## 2021-01-20 MED ORDER — OXYTOCIN-SODIUM CHLORIDE 30-0.9 UT/500ML-% IV SOLN
1.0000 m[IU]/min | INTRAVENOUS | Status: DC
  Administered 2021-01-20 (×2): 2 m[IU]/min via INTRAVENOUS
  Filled 2021-01-20: qty 500

## 2021-01-20 MED ORDER — ONDANSETRON HCL 4 MG/2ML IJ SOLN: 4.0000 mg | Freq: Four times a day (QID) | INTRAMUSCULAR | Status: DC | PRN

## 2021-01-20 MED ORDER — LIDOCAINE HCL (PF) 1 % IJ SOLN: 30.0000 mL | INTRAMUSCULAR | Status: DC | PRN

## 2021-01-20 MED ORDER — FENTANYL-BUPIVACAINE-NACL 0.5-0.125-0.9 MG/250ML-% EP SOLN
EPIDURAL | Status: DC | PRN
  Administered 2021-01-20: 12 mL/h via EPIDURAL

## 2021-01-20 MED ORDER — LACTATED RINGERS AMNIOINFUSION: INTRAVENOUS | Status: DC

## 2021-01-20 MED ORDER — PHENYLEPHRINE 40 MCG/ML (10ML) SYRINGE FOR IV PUSH (FOR BLOOD PRESSURE SUPPORT): 80.0000 ug | PREFILLED_SYRINGE | INTRAVENOUS | Status: DC | PRN

## 2021-01-20 MED ORDER — LIDOCAINE HCL (PF) 1 % IJ SOLN
INTRAMUSCULAR | Status: DC | PRN
  Administered 2021-01-20 (×2): 4 mL via EPIDURAL

## 2021-01-20 MED ORDER — LACTATED RINGERS IV SOLN
500.0000 mL | INTRAVENOUS | Status: DC | PRN
  Administered 2021-01-20: 500 mL via INTRAVENOUS

## 2021-01-20 MED ORDER — DIPHENHYDRAMINE HCL 50 MG/ML IJ SOLN: 12.5000 mg | INTRAMUSCULAR | Status: DC | PRN

## 2021-01-20 MED ORDER — LACTATED RINGERS IV SOLN: 500.0000 mL | Freq: Once | INTRAVENOUS | Status: DC

## 2021-01-20 MED ORDER — OXYTOCIN-SODIUM CHLORIDE 30-0.9 UT/500ML-% IV SOLN
2.5000 [IU]/h | INTRAVENOUS | Status: DC
  Administered 2021-01-21: 2.5 [IU]/h via INTRAVENOUS

## 2021-01-20 MED ORDER — LACTATED RINGERS IV SOLN
500.0000 mL | Freq: Once | INTRAVENOUS | Status: AC
  Administered 2021-01-20: 500 mL via INTRAVENOUS

## 2021-01-20 MED ORDER — SOD CITRATE-CITRIC ACID 500-334 MG/5ML PO SOLN: 30.0000 mL | ORAL | Status: DC | PRN

## 2021-01-20 MED ORDER — OXYCODONE-ACETAMINOPHEN 5-325 MG PO TABS: 1.0000 | ORAL_TABLET | ORAL | Status: DC | PRN

## 2021-01-20 MED ORDER — OXYTOCIN BOLUS FROM INFUSION
333.0000 mL | Freq: Once | INTRAVENOUS | Status: AC
  Administered 2021-01-21: 333 mL via INTRAVENOUS

## 2021-01-20 NOTE — Progress Notes (Signed)
Michelle Faulkner is a 30 y.o. G3P2002 at 30w0dadmitted for induction of labor due to Post dates. Due date 01/13/2021.  Subjective: Pt comfortable with epidural. No lof no vaginal bleeding +FM  Objective: BP 116/74   Pulse 92   Temp 98.2 F (36.8 C) (Oral)   Resp 16   Ht 5\' 3"  (1.6 m)   Wt 100.7 kg   LMP 04/08/2020   SpO2 100%   BMI 39.34 kg/m  No intake/output data recorded. Total I/O In: -  Out: 500 [Urine:500]  FHT:  FHR: 135 bpm, variability: moderate,  accelerations:  Present,  decelerations:  Absent UC:   regular, every 2-3 minutes SVE:   Dilation: 4 Effacement (%): 70, 80 Station: -2 Exam by:: Dr. [email or web address]  AROM clear fluid IUPC placed without difficulty   Labs: Lab Results  Component Value Date   WBC 7.7 01/20/2021   HGB 10.5 (L) 01/20/2021   HCT 31.0 (L) 01/20/2021   MCV 89.6 01/20/2021   PLT 155 01/20/2021    Assessment / Plan: Induction of labor due to postterm,  progressing well on pitocin  Labor: Progressing normally Preeclampsia:   NA Fetal Wellbeing:  Category I Pain Control:  Epidural I/D:  n/a Anticipated MOD:  NSVD [facility] covering after 7 pm   14/07/2020 01/20/2021, 6:10 PM

## 2021-01-20 NOTE — Anesthesia Procedure Notes (Signed)
Epidural Patient location during procedure: OB Start time: 01/20/2021 2:12 PM End time: 01/20/2021 2:15 PM  Staffing Anesthesiologist: Kaylyn Layer, MD Performed: anesthesiologist   Preanesthetic Checklist Completed: patient identified, IV checked, risks and benefits discussed, monitors and equipment checked, pre-op evaluation and timeout performed  Epidural Patient position: sitting Prep: DuraPrep and site prepped and draped Patient monitoring: continuous pulse ox, blood pressure and heart rate Approach: midline Location: L3-L4 Injection technique: LOR air  Needle:  Needle type: Tuohy  Needle gauge: 17 G Needle length: 9 cm Needle insertion depth: 6 cm Catheter type: closed end flexible Catheter size: 19 Gauge Catheter at skin depth: 11 cm Test dose: negative and Other (1% lidocaine)  Assessment Events: blood not aspirated, injection not painful, no injection resistance, no paresthesia and negative IV test  Additional Notes Patient identified. Risks, benefits, and alternatives discussed with patient including but not limited to bleeding, infection, nerve damage, paralysis, failed block, incomplete pain control, headache, blood pressure changes, nausea, vomiting, reactions to medication, itching, and postpartum back pain. Confirmed with bedside nurse the patient's most recent platelet count. Confirmed with patient that they are not currently taking any anticoagulation, have any bleeding history, or any family history of bleeding disorders. Patient expressed understanding and wished to proceed. All questions were answered. Sterile technique was used throughout the entire procedure. Please see nursing notes for vital signs.   Crisp LOR on first pass. Test dose was given through epidural catheter and negative prior to continuing to dose epidural or start infusion. Warning signs of high block given to the patient including shortness of breath, tingling/numbness in hands, complete  motor block, or any concerning symptoms with instructions to call for help. Patient was given instructions on fall risk and not to get out of bed. All questions and concerns addressed with instructions to call with any issues or inadequate analgesia.  Reason for block:procedure for pain

## 2021-01-20 NOTE — H&P (Signed)
Michelle Faulkner is a 30 y.o. female G3P2002 at 41 weeks and 0 days  presenting for induction due to postdates. 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 Gerald Leitz with [facility].  OB History     Gravida  3   Para  2   Term  2   Preterm      AB      Living  2      SAB      IAB      Ectopic      Multiple      Live Births  2          Past Medical History:  Diagnosis Date   Anemia    Anxiety    Asthma    Depression    Gonorrhea 2007   HSV (herpes simplex virus) infection    Migraine    Vaginal Pap smear, abnormal    Past Surgical History:  Procedure Laterality Date   COLPOSCOPY     WISDOM TOOTH EXTRACTION     Family History: family history includes Anxiety disorder in her mother; Asthma in her mother; Diabetes in her mother; Ulcers in her sister. Social History:  reports that she has never smoked. She has been exposed to tobacco smoke. She has never used smokeless tobacco. She reports that she does not currently use alcohol after a past usage of about 1.0 standard drink per week. She reports that she does not currently use drugs after having used the following drugs: Marijuana.     Maternal Diabetes: No Genetic Screening: Declined Maternal Ultrasounds/Referrals: Normal Fetal Ultrasounds or other Referrals:  None Maternal Substance Abuse:  No Significant Maternal Medications:  None Significant Maternal Lab Results:  Group B Strep negative Other Comments:  None  Review of Systems  Constitutional: Negative.   HENT: Negative.    Eyes: Negative.   Respiratory: Negative.    Cardiovascular: Negative.   Gastrointestinal: Negative.   Endocrine: Negative.   Genitourinary: Negative.   Musculoskeletal: Negative.   Skin: Negative.   Allergic/Immunologic: Negative.   Neurological: Negative.   Hematological: Negative.   Psychiatric/Behavioral: Negative.    History Dilation: 3 Effacement (%):  50 Station: -3 Exam by:: Dr. Richardson Dopp Blood pressure 119/79, pulse 82, temperature 98.5 F (36.9 C), temperature source Oral, resp. rate 16, height 5\' 3"  (1.6 m), weight 100.7 kg, last menstrual period 04/08/2020, currently breastfeeding. Maternal Exam:  Introitus: Normal vulva.  Physical Exam Vitals reviewed.  Constitutional:      Appearance: Normal appearance.  HENT:     Head: Normocephalic and atraumatic.     Nose: Nose normal.     Mouth/Throat:     Mouth: Mucous membranes are moist.  Eyes:     Pupils: Pupils are equal, round, and reactive to light.  Cardiovascular:     Rate and Rhythm: Normal rate and regular rhythm.     Pulses: Normal pulses.     Heart sounds: Normal heart sounds.  Pulmonary:     Effort: Pulmonary effort is normal.     Breath sounds: Normal breath sounds.  Abdominal:     Tenderness: There is no abdominal tenderness.  Genitourinary:    General: Normal vulva.  Musculoskeletal:        General: Swelling present. Normal range of motion.     Cervical back: Normal range of motion and neck supple.  Skin:    General: Skin is warm and dry.  Neurological:  General: No focal deficit present.     Mental Status: She is alert and oriented to person, place, and time.  Psychiatric:        Mood and Affect: Mood normal.        Behavior: Behavior normal.    Prenatal labs: ABO, Rh: --/--/A POS (12/06 1540) Antibody: NEG (12/06 0937) Rubella: Immune (04/08 0000) RPR: NON REACTIVE (12/06 0937)  HBsAg: Negative (04/08 0000)  HIV: Non-reactive (04/08 0000)  GBS: Negative/-- (10/31 0000)   Assessment/Plan: 41 weeks and 0 days post dates for induction.  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

## 2021-01-20 NOTE — Progress Notes (Signed)
Subjective:    Pt on H&K with peanut ball. Comfortable with epidural.  Objective:    VS: BP (!) 96/59   Pulse 87   Temp 98.3 F (36.8 C) (Oral)   Resp 16   Ht 5\' 3"  (1.6 m)   Wt 100.7 kg   LMP 04/08/2020   SpO2 100%   BMI 39.34 kg/m  FHR : baseline 135 / variability moderate / accelerations present / Early decelerations Toco: contractions every 4-5 minutes / MVU 90 Membranes: AROM 1755 Dilation: 4 Effacement (%): 70, 80 Station: -2 Presentation: Vertex Exam by:: Dr. [email or web address] Pitocin off  Assessment/Plan:   30 y.o. 38 [redacted]w[redacted]d IOL for post dates.  Labor:  Will restart pitocin. Titrate 2x2 until ctx are adequate. Preeclampsia:  no signs or symptoms of toxicity Fetal Wellbeing:  Category I Pain Control:  Epidural I/D:   GBS  negative Anticipated MOD:  NSVD  [redacted]w[redacted]d MSN, CNM 01/20/2021 8:37 PM

## 2021-01-20 NOTE — Anesthesia Preprocedure Evaluation (Signed)
Anesthesia Evaluation  Patient identified by MRN, date of birth, ID band Patient awake    Reviewed: Allergy & Precautions, Patient's Chart, lab work & pertinent test results  History of Anesthesia Complications Negative for: history of anesthetic complications  Airway Mallampati: II  TM Distance: >3 FB Neck ROM: Full    Dental no notable dental hx.    Pulmonary asthma ,    Pulmonary exam normal        Cardiovascular negative cardio ROS Normal cardiovascular exam     Neuro/Psych  Headaches, negative psych ROS   GI/Hepatic negative GI ROS, Neg liver ROS,   Endo/Other  negative endocrine ROS  Renal/GU negative Renal ROS  negative genitourinary   Musculoskeletal negative musculoskeletal ROS (+)   Abdominal   Peds  Hematology  (+) anemia ,   Anesthesia Other Findings Day of surgery medications reviewed with patient.  Reproductive/Obstetrics (+) Pregnancy                             Anesthesia Physical Anesthesia Plan  ASA: 2  Anesthesia Plan: Epidural   Post-op Pain Management:    Induction:   PONV Risk Score and Plan: Treatment may vary due to age or medical condition  Airway Management Planned: Natural Airway  Additional Equipment: Fetal Monitoring  Intra-op Plan:   Post-operative Plan:   Informed Consent: I have reviewed the patients History and Physical, chart, labs and discussed the procedure including the risks, benefits and alternatives for the proposed anesthesia with the patient or authorized representative who has indicated his/her understanding and acceptance.       Plan Discussed with:   Anesthesia Plan Comments:         Anesthesia Quick Evaluation

## 2021-01-21 ENCOUNTER — Encounter (HOSPITAL_COMMUNITY): Payer: Self-pay | Admitting: Obstetrics and Gynecology

## 2021-01-21 LAB — CBC
HCT: 27.2 % — ABNORMAL LOW (ref 36.0–46.0)
Hemoglobin: 9.3 g/dL — ABNORMAL LOW (ref 12.0–15.0)
MCH: 30.7 pg (ref 26.0–34.0)
MCHC: 34.2 g/dL (ref 30.0–36.0)
MCV: 89.8 fL (ref 80.0–100.0)
Platelets: 255 10*3/uL (ref 150–400)
RBC: 3.03 MIL/uL — ABNORMAL LOW (ref 3.87–5.11)
RDW: 14.9 % (ref 11.5–15.5)
WBC: 9.8 10*3/uL (ref 4.0–10.5)
nRBC: 0 % (ref 0.0–0.2)

## 2021-01-21 MED ORDER — SIMETHICONE 80 MG PO CHEW: 80.0000 mg | CHEWABLE_TABLET | ORAL | Status: DC | PRN

## 2021-01-21 MED ORDER — ACETAMINOPHEN 325 MG PO TABS
650.0000 mg | ORAL_TABLET | ORAL | Status: DC | PRN
  Administered 2021-01-21: 650 mg via ORAL
  Filled 2021-01-21: qty 2

## 2021-01-21 MED ORDER — IBUPROFEN 600 MG PO TABS
600.0000 mg | ORAL_TABLET | Freq: Four times a day (QID) | ORAL | 0 refills | Status: DC | PRN
Start: 2021-01-21 — End: 2022-01-15

## 2021-01-21 MED ORDER — WITCH HAZEL-GLYCERIN EX PADS: 1.0000 "application " | MEDICATED_PAD | CUTANEOUS | Status: DC | PRN

## 2021-01-21 MED ORDER — ONDANSETRON HCL 4 MG/2ML IJ SOLN: 4.0000 mg | INTRAMUSCULAR | Status: DC | PRN

## 2021-01-21 MED ORDER — DIBUCAINE (PERIANAL) 1 % EX OINT: 1.0000 "application " | TOPICAL_OINTMENT | CUTANEOUS | Status: DC | PRN

## 2021-01-21 MED ORDER — OXYCODONE HCL 5 MG PO TABS: 10.0000 mg | ORAL_TABLET | ORAL | Status: DC | PRN

## 2021-01-21 MED ORDER — BENZOCAINE-MENTHOL 20-0.5 % EX AERO: 1.0000 "application " | INHALATION_SPRAY | CUTANEOUS | Status: DC | PRN

## 2021-01-21 MED ORDER — COCONUT OIL OIL: 1.0000 "application " | TOPICAL_OIL | Status: DC | PRN

## 2021-01-21 MED ORDER — SENNOSIDES-DOCUSATE SODIUM 8.6-50 MG PO TABS: 2.0000 | ORAL_TABLET | Freq: Every day | ORAL | Status: DC

## 2021-01-21 MED ORDER — PRENATAL MULTIVITAMIN CH
1.0000 | ORAL_TABLET | Freq: Every day | ORAL | Status: DC
  Administered 2021-01-21: 1 via ORAL
  Filled 2021-01-21: qty 1

## 2021-01-21 MED ORDER — ZOLPIDEM TARTRATE 5 MG PO TABS: 5.0000 mg | ORAL_TABLET | Freq: Every evening | ORAL | Status: DC | PRN

## 2021-01-21 MED ORDER — ACETAMINOPHEN 325 MG PO TABS: 650.0000 mg | ORAL_TABLET | ORAL | Status: DC | PRN

## 2021-01-21 MED ORDER — DIPHENHYDRAMINE HCL 25 MG PO CAPS: 25.0000 mg | ORAL_CAPSULE | Freq: Four times a day (QID) | ORAL | Status: DC | PRN

## 2021-01-21 MED ORDER — IBUPROFEN 600 MG PO TABS
600.0000 mg | ORAL_TABLET | Freq: Four times a day (QID) | ORAL | Status: DC
  Administered 2021-01-21 – 2021-01-22 (×5): 600 mg via ORAL
  Filled 2021-01-21 (×5): qty 1

## 2021-01-21 MED ORDER — ONDANSETRON HCL 4 MG PO TABS: 4.0000 mg | ORAL_TABLET | ORAL | Status: DC | PRN

## 2021-01-21 MED ORDER — OXYCODONE HCL 5 MG PO TABS: 5.0000 mg | ORAL_TABLET | ORAL | Status: DC | PRN

## 2021-01-21 MED ORDER — TETANUS-DIPHTH-ACELL PERTUSSIS 5-2.5-18.5 LF-MCG/0.5 IM SUSY: 0.5000 mL | PREFILLED_SYRINGE | Freq: Once | INTRAMUSCULAR | Status: DC

## 2021-01-21 NOTE — Anesthesia Postprocedure Evaluation (Signed)
Anesthesia Post Note  Patient: Michelle Faulkner  Procedure(s) Performed: AN AD HOC LABOR EPIDURAL     Patient location during evaluation: Mother Baby Anesthesia Type: Epidural Level of consciousness: awake and alert Pain management: pain level controlled Vital Signs Assessment: post-procedure vital signs reviewed and stable Respiratory status: spontaneous breathing, nonlabored ventilation and respiratory function stable Cardiovascular status: stable Postop Assessment: no headache, no backache and epidural receding Anesthetic complications: no   No notable events documented.  Last Vitals:  Vitals:   01/21/21 1130 01/21/21 1530  BP: 112/80 110/70  Pulse: 90 87  Resp: 18 17  Temp: 36.8 C 37.1 C  SpO2: 99%     Last Pain:  Vitals:   01/21/21 1719  TempSrc:   PainSc: 0-No pain                 Xaiver Roskelley

## 2021-01-21 NOTE — Lactation Note (Addendum)
This note was copied from a baby's chart. Lactation Consultation Note  Patient Name: Michelle Faulkner INOMV'E Date: 01/21/2021 Reason for consult: Initial assessment;Term Age:31 hours Late BF consult put in. See mom's MR for medical hx. Mom is experienced at breastfeeding and mom feels breastfeeding is going well, infant last BF for 20 minutes 1500 pm, per mom she only felt tug and no pain with latch. LC entered the room, infant was finishing bath with NT, infant was cuing, mom attempt latch infant but infant only held nipple in mouth and was sleepy at this time. Mom will do 1 hour of skin to skin with infant. Mom has been pre-pumping right breast only due being short shafted, RN gave mom hand pump, LC re-fitted with 27 mm breast flange for better fit. Mom knows to breastfeed infant according to feeding cues, 8 to 12 or more times within 24 hours, skin to skin. Mom knows to call RN/LC if she has breastfeeding questions, concerns or needs further assistance with latching infant at the breast.  Mom made aware of O/P services, breastfeeding support groups, community resources, and our phone # for post-discharge questions.    Maternal Data Has patient been taught Hand Expression?: Yes Does the patient have breastfeeding experience prior to this delivery?: Yes How long did the patient breastfeed?: Per mom, she BF 1st child for one year and 2nd child who is 8 years for 7 months.  Feeding Mother's Current Feeding Choice: Breast Milk  LATCH Score Latch: Too sleepy or reluctant, no latch achieved, no sucking elicited. (Infant recently had bath and was sleepy not interested in latching at this time, mom will do 1 hour of skin to skin with infant.)  Audible Swallowing: None  Type of Nipple: Everted at rest and after stimulation  Comfort (Breast/Nipple): Soft / non-tender  Hold (Positioning): Assistance needed to correctly position infant at breast and maintain latch.  LATCH Score:  5   Lactation Tools Discussed/Used Tools: Pump Breast pump type: Manual Pump Education: Setup, frequency, and cleaning;Milk Storage Reason for Pumping: Mom right breast is slightly short shafted and mom given hand pump pre-pump prior to latching infant and mom was re-fitted with 27 mm breast flange for best fit. Pumping frequency: pre-pump right breast prior to latching infant.  Interventions    Discharge Discharge Education: Engorgement and breast care;Warning signs for feeding baby;Outpatient recommendation Pump: Manual WIC Program: No  Consult Status Consult Status: Follow-up Date: 01/22/21 Follow-up type: In-patient    Danelle Earthly 01/21/2021, 5:34 PM

## 2021-01-21 NOTE — Progress Notes (Signed)
Post Partum Day 0 Subjective: no complaints, up ad lib, voiding, tolerating PO, and + flatus  Objective: Blood pressure 110/70, pulse 87, temperature 98.8 F (37.1 C), temperature source Oral, resp. rate 17, height 5\' 3"  (1.6 m), weight 100.7 kg, last menstrual period 04/08/2020, SpO2 99 %, unknown if currently breastfeeding.  Physical Exam:  General: alert, cooperative, and no distress Lochia: appropriate Uterine Fundus: firm Incision: NA DVT Evaluation: No evidence of DVT seen on physical exam.  Recent Labs    01/20/21 0937 01/21/21 0729  HGB 10.5* 9.3*  HCT 31.0* 27.2*    Assessment/Plan: Plan for discharge tomorrow, Breastfeeding, and Lactation consult Routine postpartum care  Pt desires early discharge home on PPD #1    LOS: 1 day   14/07/22 01/21/2021, 5:44 PM

## 2021-01-22 LAB — URINE CULTURE
Culture: NO GROWTH
Special Requests: NORMAL

## 2021-01-22 NOTE — Discharge Summary (Signed)
Postpartum Discharge Summary  Date of Service: 01/22/21     Patient Name: Michelle Faulkner DOB: 1990/05/30 MRN: [id number]  Date of admission: 01/20/2021 Delivery date:01/21/2021  Delivering provider: Holli Humbles B  Date of discharge: 01/22/2021  Admitting diagnosis: Post-dates pregnancy [O48.0] Intrauterine pregnancy: [redacted]w[redacted]d    Secondary diagnosis:  Principal Problem:   Post-dates pregnancy HSV2 Anxiety and depression Obesity (BMI 39) Additional problems: None    Discharge diagnosis: Term Pregnancy Delivered                                              Post partum procedures: None Augmentation: AROM and Pitocin Complications: None  Hospital course: Induction of Labor With Vaginal Delivery   30y.o. yo G3P3003 at 488w1das admitted to the hospital 01/20/2021 for induction of labor.  Indication for induction:  Postdates .  Patient had an uncomplicated labor course as follows: Membrane Rupture Time/Date: 5:55 PM ,01/20/2021   Delivery Method:Vaginal, Spontaneous  Episiotomy: None  Lacerations:  None  Details of delivery can be found in separate delivery note.  Patient had a routine postpartum course. Patient is discharged home 01/22/21.  Newborn Data: Birth date:01/21/2021  Birth time:12:16 AM  Gender:Female  Living status:Living  Apgars:8 ,9  Weight:2970 g   Magnesium Sulfate received: No BMZ received: No Rhophylac:N/A MMR:N/A T-DaP:Given prenatally Flu: Yes Transfusion:No  Physical exam  Vitals:   01/21/21 1130 01/21/21 1530 01/21/21 2153 01/22/21 0547  BP: 112/80 110/70 126/81 107/68  Pulse: 90 87 83 81  Resp: _0 Temp: 98.2 F (36.8 C) 98.8 F (37.1 C) 98.3 F (36.8 C) 98 F (36.7 C)  TempSrc: Oral Oral Oral Oral  SpO2: 99%  99%   Weight:      Height:       General: alert, cooperative, and no distress Lochia: appropriate Uterine Fundus: firm Incision: N/A DVT Evaluation: No evidence of DVT seen on physical exam. No cords or calf  tenderness. Labs: Lab Results  Component Value Date   WBC 9.8 01/21/2021   HGB 9.3 (L) 01/21/2021   HCT 27.2 (L) 01/21/2021   MCV 89.8 01/21/2021   PLT 255 01/21/2021   CMP Latest Ref Rng & Units 06/09/2020  Glucose 70 - 99 mg/dL 80  BUN 6 - 20 mg/dL 8  Creatinine 0.44 - 1.00 mg/dL 0.59  Sodium 135 - 145 mmol/L 132(L)  Potassium 3.5 - 5.1 mmol/L 4.0  Chloride 98 - 111 mmol/L 101  CO2 22 - 32 mmol/L 21(L)  Calcium 8.9 - 10.3 mg/dL 9.3  Total Protein 6.5 - 8.1 g/dL 7.2  Total Bilirubin 0.3 - 1.2 mg/dL 0.6  Alkaline Phos 38 - 126 U/L 39  AST 15 - 41 U/L 20  ALT 0 - 44 U/L 14   Edinburgh Score: Edinburgh Postnatal Depression Scale Screening Tool 01/21/2021  I have been able to laugh and see the funny side of things. 0  I have looked forward with enjoyment to things. 0  I have blamed myself unnecessarily when things went wrong. 1  I have been anxious or worried for no good reason. 2  I have felt scared or panicky for no good reason. 1  Things have been getting on top of me. 1  I have been so unhappy that I have had difficulty sleeping. 0  I have felt sad or miserable. 1  I have been so unhappy that I have been crying. 1  The thought of harming myself has occurred to me. 0  Edinburgh Postnatal Depression Scale Total 7      After visit meds:  Allergies as of 01/22/2021   No Known Allergies      Medication List     STOP taking these medications    ferrous sulfate 325 (65 FE) MG EC tablet   valACYclovir 1000 MG tablet Commonly known as: VALTREX       TAKE these medications    acetaminophen 325 MG tablet Commonly known as: Tylenol Take 2 tablets (650 mg total) by mouth every 4 (four) hours as needed (for pain scale < 4).   albuterol 108 (90 Base) MCG/ACT inhaler Commonly known as: VENTOLIN HFA Inhale 2 puffs into the lungs every 6 (six) hours as needed. For shortness of breath   docusate sodium 100 MG capsule Commonly known as: COLACE Take 100 mg by mouth  daily.   ibuprofen 600 MG tablet Commonly known as: ADVIL Take 1 tablet (600 mg total) by mouth every 6 (six) hours as needed.   prenatal multivitamin Tabs tablet Take 1 tablet by mouth daily.   sertraline 25 MG tablet Commonly known as: ZOLOFT Take 25 mg by mouth daily.   vitamin B-6 25 MG tablet Commonly known as: pyridOXINE Take 25 mg by mouth daily.         Discharge home in stable condition Infant Feeding: Breast Infant Disposition:home with mother Discharge instruction: per After Visit Summary and Postpartum booklet. Activity: Advance as tolerated. Pelvic rest for 6 weeks.  Diet: routine diet Anticipated Birth Control:  To be discussed Postpartum Appointment:6 weeks Additional Postpartum F/U: Postpartum Depression checkup in 2 weeks Future Appointments:No future appointments. Follow up Visit:  Follow-up Information     Christophe Louis, MD. Schedule an appointment as soon as possible for a visit in 2 week(s).   Specialty: Obstetrics and Gynecology Why: please make an appointment for postpartum visit to evaluate for postpartum depression in 2 weeks. It is ok for this to be a televisit Contact information: [address] [place] [address] [phone]                     01/22/2021 Drema Dallas, DO

## 2021-01-22 NOTE — Social Work (Addendum)
MOB was referred for history of depression/anxiety/panic attacks.  * Referral screened out by Clinical Social Worker because none of the following criteria appear to apply: ~ History of anxiety/depression during this pregnancy, or of post-partum depression following prior delivery. ~ Diagnosis of anxiety and/or depression within last 3 years OR * MOB's symptoms currently being treated with medication and/or therapy. Per chart review, MOB takes Sertraline 25mg  for symptoms.   Please contact the Clinical Social Worker if needs arise, by [facility] request, or if MOB scores greater than 9/yes to question 10 on Edinburgh Postpartum Depression Screen.   04-23-1978, MSW, LCSW [facility]  Clinical Social Worker  [phone] 2020/07/30  8:45 AM

## 2021-01-22 NOTE — Lactation Note (Signed)
This note was copied from a baby's chart. Lactation Consultation Note  Patient Name: Girl Wyllow Seigler [id number] Date: 01/22/2021 Reason for consult: Follow-up assessment;Term Age:31 hours   P3 mother whose infant is now 10 hours old.  This is a term baby at 41+1 weeks.  Mother breast fed her first child for one year and her second child for 7 months.  Mother reported that baby has been cluster feeding.  Her nipples are tender; very small blister noted to right nipple.  Provided coconut oil and comfort gels with instructions for use.  Mother appreciative.  Offered to assist with swaddling baby so mother can rest; mother grateful.  Swaddled securely and placed in bassinet.  Encouraged mother to call for any questions/concerns.  Family is hoping for a discharge today.  Baby has fed multiple times and mother has no concerns; voiding/stooling well  Father present.   Maternal Data    Feeding Mother's Current Feeding Choice: Breast Milk  LATCH Score                    Lactation Tools Discussed/Used Tools: Coconut oil;Pump;Shells Breast pump type: Manual Pumping frequency: PRN  Interventions    Discharge Discharge Education: Engorgement and breast care Pump: Manual;Personal WIC Program: No  Consult Status Consult Status: Complete Date: 01/22/21 Follow-up type: Call as needed    Lorisa Scheid R Angelis Gates 01/22/2021, 10:11 AM

## 2021-02-04 ENCOUNTER — Telehealth (HOSPITAL_COMMUNITY): Payer: Self-pay | Admitting: *Deleted

## 2021-02-04 NOTE — Telephone Encounter (Signed)
Patient voiced no questions or concerns at this time. EPDS=8. Patient reports a history of anxiety and depression. Stated that she is not currently on any medications. Patient was to schedule a 2 week mood check with OB - patient unsure if she has an appointment scheduled. RN instructed patient to call OB office to schedule appointment - may want to discuss medications with OB. Patient verbalized understanding. Patient reported that pediatrician has been monitoring infant's weight. Stated, "She's gaining, just not as fast as they would like." Patient voiced no other concerns regarding infant at this time. Patient reports infant sleeps in a Pack-N-Play bassinet on her back. RN reviewed ABCs of safe sleep. Patient verbalized understanding. Patient requested RN email information on hospital's virtual postpartum classes and support groups. Patient also requested information regarding maternal mental health resources. Email sent. Deforest Hoyles, RN, 02/04/21, 1024

## 2021-12-06 ENCOUNTER — Emergency Department (HOSPITAL_COMMUNITY): Payer: Medicaid Other

## 2021-12-06 ENCOUNTER — Encounter (HOSPITAL_COMMUNITY): Payer: Self-pay | Admitting: Emergency Medicine

## 2021-12-06 ENCOUNTER — Other Ambulatory Visit: Payer: Self-pay

## 2021-12-06 ENCOUNTER — Emergency Department (HOSPITAL_COMMUNITY)
Admission: EM | Admit: 2021-12-06 | Discharge: 2021-12-07 | Discharge status: Against Medical Advice | Payer: Medicaid Other | Attending: Emergency Medicine | Admitting: Emergency Medicine

## 2021-12-06 DIAGNOSIS — R0602 Shortness of breath: Secondary | ICD-10-CM | POA: Insufficient documentation

## 2021-12-06 DIAGNOSIS — R2243 Localized swelling, mass and lump, lower limb, bilateral: Secondary | ICD-10-CM | POA: Diagnosis not present

## 2021-12-06 DIAGNOSIS — Z1152 Encounter for screening for COVID-19: Secondary | ICD-10-CM | POA: Diagnosis not present

## 2021-12-06 DIAGNOSIS — R072 Precordial pain: Secondary | ICD-10-CM | POA: Insufficient documentation

## 2021-12-06 DIAGNOSIS — Z5321 Procedure and treatment not carried out due to patient leaving prior to being seen by health care provider: Secondary | ICD-10-CM | POA: Insufficient documentation

## 2021-12-06 DIAGNOSIS — J45909 Unspecified asthma, uncomplicated: Secondary | ICD-10-CM | POA: Insufficient documentation

## 2021-12-06 DIAGNOSIS — R059 Cough, unspecified: Secondary | ICD-10-CM | POA: Insufficient documentation

## 2021-12-06 NOTE — ED Provider Triage Note (Signed)
Emergency Medicine Provider Triage Evaluation Note  ULLA MCKIERNAN , a 31 y.o. female  was evaluated in triage.  Pt complains of intermittent chest pains over the past couple of weeks and approximately 5 days of lower extremity swelling and cough.  She feels like she may have some swelling around her eyes as well.  No urine changes.  No history of kidney problems other than UTI.  No history of heart problems or heart failure.  She does not currently have chest pain.  No recent fevers.  Cough is nonproductive.  States that her shoes are fitting tighter and that her legs are sore, but this is bilateral.  Review of Systems  Positive: Lower extremity swelling, cough, chest pain Negative: Fever  Physical Exam  Ht 5\' 3"  (1.6 m)   Wt 100.7 kg   LMP 12/02/2021   BMI 39.33 kg/m  Gen:   Awake, no distress   Resp:  Normal effort, lungs clear to auscultation bilaterally MSK:   Moves extremities without difficulty  Other:  Mild edema of the ankles and pretibial area, not pitting  Medical Decision Making  Medically screening exam initiated at 11:31 PM.  Appropriate orders placed.  Alaiza ZAILAH ZAGAMI was informed that the remainder of the evaluation will be completed by another provider, this initial triage assessment does not replace that evaluation, and the importance of remaining in the ED until their evaluation is complete.     Carlisle Cater, PA-C 12/06/21 2333

## 2021-12-06 NOTE — ED Triage Notes (Signed)
Pt c/o bilateral lower extremity swelling, cough, and SHOB since Tuesday.

## 2021-12-07 ENCOUNTER — Encounter (HOSPITAL_BASED_OUTPATIENT_CLINIC_OR_DEPARTMENT_OTHER): Payer: Self-pay | Admitting: *Deleted

## 2021-12-07 ENCOUNTER — Emergency Department (HOSPITAL_BASED_OUTPATIENT_CLINIC_OR_DEPARTMENT_OTHER)
Admission: EM | Admit: 2021-12-07 | Discharge: 2021-12-07 | Disposition: A | Discharge status: Home / Self Care | Payer: Medicaid Other | Source: Home / Self Care | Attending: Emergency Medicine | Admitting: Emergency Medicine

## 2021-12-07 DIAGNOSIS — J45909 Unspecified asthma, uncomplicated: Secondary | ICD-10-CM | POA: Insufficient documentation

## 2021-12-07 DIAGNOSIS — Z1152 Encounter for screening for COVID-19: Secondary | ICD-10-CM | POA: Insufficient documentation

## 2021-12-07 DIAGNOSIS — R072 Precordial pain: Secondary | ICD-10-CM | POA: Insufficient documentation

## 2021-12-07 LAB — COMPREHENSIVE METABOLIC PANEL
ALT: 30 U/L (ref 0–44)
AST: 18 U/L (ref 15–41)
Albumin: 3.4 g/dL — ABNORMAL LOW (ref 3.5–5.0)
Alkaline Phosphatase: 58 U/L (ref 38–126)
Anion gap: 5 (ref 5–15)
BUN: 15 mg/dL (ref 6–20)
CO2: 24 mmol/L (ref 22–32)
Calcium: 8.8 mg/dL — ABNORMAL LOW (ref 8.9–10.3)
Chloride: 107 mmol/L (ref 98–111)
Creatinine, Ser: 0.69 mg/dL (ref 0.44–1.00)
GFR, Estimated: >60 mL/min (ref >60)
Glucose, Bld: 107 mg/dL — ABNORMAL HIGH (ref 70–99)
Potassium: 3.5 mmol/L (ref 3.5–5.1)
Sodium: 136 mmol/L (ref 135–145)
Total Bilirubin: 0.5 mg/dL (ref 0.3–1.2)
Total Protein: 7 g/dL (ref 6.5–8.1)

## 2021-12-07 LAB — CBC WITH DIFFERENTIAL/PLATELET
Abs Immature Granulocytes: 0.01 10*3/uL (ref 0.00–0.07)
Basophils Absolute: 0.1 10*3/uL (ref 0.0–0.1)
Basophils Relative: 1 %
Eosinophils Absolute: 0.5 10*3/uL (ref 0.0–0.5)
Eosinophils Relative: 8 %
HCT: 33.1 % — ABNORMAL LOW (ref 36.0–46.0)
Hemoglobin: 11.1 g/dL — ABNORMAL LOW (ref 12.0–15.0)
Immature Granulocytes: 0 %
Lymphocytes Relative: 49 %
Lymphs Abs: 3.5 10*3/uL (ref 0.7–4.0)
MCH: 30.2 pg (ref 26.0–34.0)
MCHC: 33.5 g/dL (ref 30.0–36.0)
MCV: 89.9 fL (ref 80.0–100.0)
Monocytes Absolute: 0.5 10*3/uL (ref 0.1–1.0)
Monocytes Relative: 7 %
Neutro Abs: 2.5 10*3/uL (ref 1.7–7.7)
Neutrophils Relative %: 35 %
Platelets: 373 10*3/uL (ref 150–400)
RBC: 3.68 MIL/uL — ABNORMAL LOW (ref 3.87–5.11)
RDW: 13.8 % (ref 11.5–15.5)
WBC: 7.1 10*3/uL (ref 4.0–10.5)
nRBC: 0 % (ref 0.0–0.2)

## 2021-12-07 LAB — URINALYSIS, ROUTINE W REFLEX MICROSCOPIC
Bilirubin Urine: NEGATIVE
Glucose, UA: NEGATIVE mg/dL
Ketones, ur: NEGATIVE mg/dL
Leukocytes,Ua: NEGATIVE
Nitrite: NEGATIVE
Protein, ur: NEGATIVE mg/dL
Specific Gravity, Urine: 1.02 (ref 1.005–1.030)
pH: 6 (ref 5.0–8.0)

## 2021-12-07 LAB — BASIC METABOLIC PANEL
Anion gap: 8 (ref 5–15)
BUN: 14 mg/dL (ref 6–20)
CO2: 24 mmol/L (ref 22–32)
Calcium: 8.9 mg/dL (ref 8.9–10.3)
Chloride: 107 mmol/L (ref 98–111)
Creatinine, Ser: 0.6 mg/dL (ref 0.44–1.00)
GFR, Estimated: >60 mL/min (ref >60)
Glucose, Bld: 99 mg/dL (ref 70–99)
Potassium: 3.8 mmol/L (ref 3.5–5.1)
Sodium: 139 mmol/L (ref 135–145)

## 2021-12-07 LAB — RESP PANEL BY RT-PCR (FLU A&B, COVID) ARPGX2
Influenza A by PCR: NEGATIVE
Influenza B by PCR: NEGATIVE
SARS Coronavirus 2 by RT PCR: NEGATIVE

## 2021-12-07 LAB — TROPONIN I (HIGH SENSITIVITY)
Troponin I (High Sensitivity): <2 ng/L (ref <18)
Troponin I (High Sensitivity): <2 ng/L (ref <18)

## 2021-12-07 LAB — CBC
HCT: 32.3 % — ABNORMAL LOW (ref 36.0–46.0)
Hemoglobin: 10.7 g/dL — ABNORMAL LOW (ref 12.0–15.0)
MCH: 29.6 pg (ref 26.0–34.0)
MCHC: 33.1 g/dL (ref 30.0–36.0)
MCV: 89.5 fL (ref 80.0–100.0)
Platelets: 381 10*3/uL (ref 150–400)
RBC: 3.61 MIL/uL — ABNORMAL LOW (ref 3.87–5.11)
RDW: 13.9 % (ref 11.5–15.5)
WBC: 5.8 10*3/uL (ref 4.0–10.5)
nRBC: 0 % (ref 0.0–0.2)

## 2021-12-07 LAB — URINALYSIS, MICROSCOPIC (REFLEX)

## 2021-12-07 LAB — BRAIN NATRIURETIC PEPTIDE: B Natriuretic Peptide: 16.4 pg/mL (ref 0.0–100.0)

## 2021-12-07 LAB — I-STAT BETA HCG BLOOD, ED (MC, WL, AP ONLY): I-stat hCG, quantitative: <5 m[IU]/mL (ref <5)

## 2021-12-07 LAB — D-DIMER, QUANTITATIVE: D-Dimer, Quant: <0.27 ug/mL-FEU (ref 0.00–0.50)

## 2021-12-07 MED ORDER — IPRATROPIUM-ALBUTEROL 0.5-2.5 (3) MG/3ML IN SOLN
3.0000 mL | Freq: Once | RESPIRATORY_TRACT | Status: AC
  Administered 2021-12-07: 3 mL via RESPIRATORY_TRACT
  Filled 2021-12-07: qty 3

## 2021-12-07 NOTE — Discharge Instructions (Signed)
You were seen in the emergency department today with chest discomfort.  Your work-up here does not show any evidence of heart attack, blood clot in the lungs, fluid in the lungs.  I am not seeing any evidence of pneumonia on your chest x-ray from yesterday.  Please follow close with your primary care physician and continue your albuterol inhaler as needed.  Turn to the emergency department if you develop any new or suddenly worsening symptoms.

## 2021-12-07 NOTE — ED Notes (Signed)
Pt returned her stickers to registration.  

## 2021-12-07 NOTE — ED Triage Notes (Signed)
Pt is here for cough since Tuesday as well as chest pain (even when not coughing) and swelling in bilateral feet and in face and hands and sob. Pt was seen at [facility] and [facility] due to wait and was seen at [facility] today and was advised that she needed to come back to ED

## 2021-12-07 NOTE — ED Provider Notes (Signed)
Emergency Department Provider Note   I have reviewed the triage vital signs and the nursing notes.   HISTORY  Chief Complaint Chest Pain   HPI Michelle Faulkner is a 31 y.o. female presents to the ED with cough with chest tightness and bilateral hand/foot swelling. Has felt some face swelling as well. Was on steroids 1 month prior with asthma flare but no prolonged steroid use. CP is central and pressure like. Worse with coughing but also present without cough. No hemoptysis. Labs obtained yesterday and reviewed. No fever.    Past Medical History:  Diagnosis Date   Anemia    Anxiety    Asthma    Depression    Gonorrhea 2007   HSV (herpes simplex virus) infection    Migraine    Vaginal Pap smear, abnormal     Review of Systems  Constitutional: No fever/chills ENT: No sore throat. Cardiovascular: Positive chest pain. Respiratory: Denies shortness of breath. Positive cough.  Gastrointestinal: No abdominal pain.  No nausea, no vomiting.  No diarrhea.  No constipation. Genitourinary: Negative for dysuria. Musculoskeletal: Negative for back pain.  ____________________________________________   PHYSICAL EXAM:  VITAL SIGNS: ED Triage Vitals  Enc Vitals Group     BP 12/07/21 1739 116/83     Pulse Rate 12/07/21 1739 87     Resp 12/07/21 1739 16     Temp 12/07/21 1739 (!) 97 F (36.1 C)     Temp Source 12/07/21 1739 Temporal     SpO2 12/07/21 1739 100 %   Constitutional: Alert and oriented. Well appearing and in no acute distress. Eyes: Conjunctivae are normal. Head: Atraumatic. Nose: No congestion/rhinnorhea. Mouth/Throat: Mucous membranes are moist. Neck: No stridor.   Cardiovascular: Normal rate, regular rhythm. Good peripheral circulation. Grossly normal heart sounds.   Respiratory: Normal respiratory effort.  No retractions. Lungs CTAB. Gastrointestinal: Soft and nontender. No distention.  Musculoskeletal: No lower extremity tenderness with trace edema. No  gross deformities of extremities. Neurologic:  Normal speech and language. No gross focal neurologic deficits are appreciated.  Skin:  Skin is warm, dry and intact. No rash noted.  ____________________________________________   LABS (all labs ordered are listed, but only abnormal results are displayed)  Labs Reviewed  CBC - Abnormal; Notable for the following components:      Result Value   RBC 3.61 (*)    Hemoglobin 10.7 (*)    HCT 32.3 (*)    All other components within normal limits  RESP PANEL BY RT-PCR (FLU A&B, COVID) ARPGX2  BASIC METABOLIC PANEL  BRAIN NATRIURETIC PEPTIDE  D-DIMER, QUANTITATIVE  TROPONIN I (HIGH SENSITIVITY)  TROPONIN I (HIGH SENSITIVITY)   ____________________________________________  EKG   EKG Interpretation  Date/Time:  Monday December 07 2021 17:40:57 EDT Ventricular Rate:  82 PR Interval:  130 QRS Duration: 78 QT Interval:  382 QTC Calculation: 446 R Axis:   24 Text Interpretation: Normal sinus rhythm Normal ECG When compared with ECG of 06-Dec-2021 23:37, PREVIOUS ECG IS PRESENT Confirmed by Alona Bene [phone]) on 12/07/2021 5:54:24 PM        ____________________________________________  RADIOLOGY  DG Chest 2 View  Result Date: 12/07/2021 CLINICAL DATA:  Shortness of breath and cough. Lower extremity swelling. EXAM: CHEST - 2 VIEW COMPARISON:  11/10/2005 FINDINGS: The heart size and mediastinal contours are within normal limits. Both lungs are clear. The visualized skeletal structures are unremarkable. IMPRESSION: No active cardiopulmonary disease. Electronically Signed   By: Burman Nieves M.D.   On: 12/07/2021  00:09    ____________________________________________   PROCEDURES  Procedure(s) performed:   Procedures  None ____________________________________________   INITIAL IMPRESSION / ASSESSMENT AND PLAN / ED COURSE  Pertinent labs & imaging results that were available during my care of the patient were reviewed by  me and considered in my medical decision making (see chart for details).   This patient is Presenting for Evaluation of CP, which does require a range of treatment options, and is a complaint that involves a high risk of morbidity and mortality.  The Differential Diagnoses  includes but is not exclusive to acute coronary syndrome, aortic dissection, pulmonary embolism, cardiac tamponade, community-acquired pneumonia, pericarditis, musculoskeletal chest wall pain, etc.   Critical Interventions-    Medications  ipratropium-albuterol (DUONEB) 0.5-2.5 (3) MG/3ML nebulizer solution 3 mL (3 mLs Nebulization Given 12/07/21 2134)    Reassessment after intervention: Symptoms improved.    I decided to review pertinent External Data, and in summary medical screening exam from yesterday with labs reviewed.   Clinical Laboratory Tests Ordered, included COVID and Flu negative. BNP normal. Troponin negative. D dimer negative.   Radiologic Tests: Considered repeat CXR but films from yesterday independently evaluated. No infiltrate. WIll defer repeat imaging.   Cardiac Monitor Tracing which shows NSR.    Social Determinants of Health Risk no active smoking history.   Medical Decision Making: Summary:  Patient with CP and SOB symptoms. SOB improved after nebs. Hold on steroids with patient's subjective face and foot/leg swelling. Plan to treat supportively for bronchitis.   Reevaluation with update and discussion with patient regarding reassuring workup. Plan for close PCP follow up and strict ED return precautions.   Considered admission but workup is reassuring and patient with largely normal vitals and comfortable appearing. Plan for d/c.   Disposition: discharge  ____________________________________________  FINAL CLINICAL IMPRESSION(S) / ED DIAGNOSES  Final diagnoses:  Precordial chest pain    Note:  This document was prepared using Dragon voice recognition software and may include  unintentional dictation errors.  Nanda Quinton, MD, [facility] Emergency Medicine    Nikoleta Dady, [facility], MD 12/09/21 1057

## 2021-12-09 ENCOUNTER — Encounter (INDEPENDENT_AMBULATORY_CARE_PROVIDER_SITE_OTHER): Payer: Self-pay

## 2022-01-15 ENCOUNTER — Encounter: Payer: Self-pay | Admitting: Internal Medicine

## 2022-01-15 ENCOUNTER — Ambulatory Visit: Payer: Medicaid Other | Admitting: Internal Medicine

## 2022-01-15 VITALS — BP 115/83 | HR 91 | Ht 63.0 in | Wt 224.4 lb

## 2022-01-15 DIAGNOSIS — R601 Generalized edema: Secondary | ICD-10-CM | POA: Insufficient documentation

## 2022-01-15 DIAGNOSIS — R079 Chest pain, unspecified: Secondary | ICD-10-CM | POA: Insufficient documentation

## 2022-01-15 DIAGNOSIS — I10 Essential (primary) hypertension: Secondary | ICD-10-CM | POA: Insufficient documentation

## 2022-01-15 NOTE — Progress Notes (Signed)
Primary Physician/Referring:  Pcp, No  Patient ID: Michelle Faulkner, female    DOB: Jul 08, 1990, 31 y.o.   MRN: [id number]  Chief Complaint  Patient presents with   Chest Pain   New Patient (Initial Visit)   HPI:    Michelle Faulkner  is a 31 y.o. female with past medical history significant for asthma and hypertension who is here to establish care with cardiology.  Patient had an episode of chest pain towards the end of October where she ended up going to the emergency department.  It was right below her left breast, sharp and stabbing in nature and it lasted for a while.  In the ED, her EKG and troponins were normal.  There was no underlying cause found for the chest pain at that time.  Additionally patient had an episode where her left arm went completely numb and then that lasted for a very long time but then went away on its own.  Her father has heart disease and multiple stents, states his heart disease started around age 71 or so.  Patient denies any shortness of breath or chest pain during or shortly after her pregnancy.  This all started around October of this year.  She did travel for a COVID clearance, the flight was pretty short however patient noticed that her legs were swollen during the entire trip.  When she came back to [place] she noticed that she had swelling in all of her extremities.  She does admit that she took a family members Lasix and she lost about 5 pounds of water weight and then felt like she was back to normal.  She did have some back pain around that time as well, she states where her kidneys are located.  Of note she was also very recently diagnosed with hypertension and placed on antihypertensive medication.  Patient is agreeable to seeing a nephrologist.  She also says she feels like sometimes she does not urinate as much as she should, despite drinking a lot of water throughout the day.  She states she could be drinking water all day and go to the bathroom only 1  time.  There is no family history of autoimmune disease.  Patient denies palpitations, diaphoresis, syncope, orthopnea, claudication, PND.  Past Medical History:  Diagnosis Date   Anemia    Anxiety    Asthma    Depression    Gonorrhea 2007   HSV (herpes simplex virus) infection    Migraine    Vaginal Pap smear, abnormal    Past Surgical History:  Procedure Laterality Date   COLPOSCOPY     WISDOM TOOTH EXTRACTION     Family History  Problem Relation Age of Onset   Heart disease Mother    Asthma Mother    Diabetes Mother    Anxiety disorder Mother    Heart attack Father    Heart disease Father    Ulcers Sister    Heart disease Maternal Grandmother    Atrial fibrillation Maternal Grandmother    Dementia Paternal Grandmother    Cancer Paternal Grandfather    Other Neg Hx     Social History   Tobacco Use   Smoking status: Never    Passive exposure: Yes   Smokeless tobacco: Never  Substance Use Topics   Alcohol use: Not Currently    Alcohol/week: 1.0 standard drink of alcohol    Types: 1 Shots of liquor per week   Marital Status: Single  ROS  Review of Systems  Cardiovascular:  Positive for chest pain and leg swelling.  Respiratory:  Positive for shortness of breath.    Objective  Blood pressure 115/83, pulse 91, height 5\' 3"  (1.6 m), weight 224 lb 6.4 oz (101.8 kg), SpO2 98 %, unknown if currently breastfeeding. Body mass index is 39.75 kg/m.     01/15/2022   10:29 AM 12/07/2021   11:45 PM 12/07/2021   11:30 PM  Vitals with BMI  Height 5\' 3"     Weight 224 lbs 6 oz    BMI 39.76    Systolic 115 95 98  Diastolic 83 73 62  Pulse 91 86 95     Physical Exam Vitals reviewed.  HENT:     Head: Normocephalic and atraumatic.  Cardiovascular:     Rate and Rhythm: Normal rate and regular rhythm.     Pulses: Normal pulses.     Heart sounds: Normal heart sounds. No murmur heard. Pulmonary:     Effort: Pulmonary effort is normal.     Breath sounds: Normal breath  sounds.  Abdominal:     General: Bowel sounds are normal.  Musculoskeletal:     Right lower leg: No edema.     Left lower leg: No edema.  Skin:    General: Skin is warm and dry.  Neurological:     Mental Status: She is alert.     Medications and allergies   Allergies  Allergen Reactions   Lactose Nausea And Vomiting and Other (See Comments)     Medication list after today's encounter   Current Outpatient Medications:    albuterol (PROVENTIL HFA;VENTOLIN HFA) 108 (90 BASE) MCG/ACT inhaler, Inhale 2 puffs into the lungs every 6 (six) hours as needed. For shortness of breath, Disp: , Rfl:    cholecalciferol (VITAMIN D3) 25 MCG (1000 UNIT) tablet, Take 1,000 Units by mouth daily., Disp: , Rfl:    norgestimate-ethinyl estradiol (ORTHO-CYCLEN) 0.25-35 MG-MCG tablet, Take 1 tablet by mouth daily., Disp: , Rfl:    olmesartan (BENICAR) 20 MG tablet, Take 1 tablet by mouth daily., Disp: , Rfl:   Laboratory examination:   Lab Results  Component Value Date   NA 139 12/07/2021   K 3.8 12/07/2021   CO2 24 12/07/2021   GLUCOSE 99 12/07/2021   BUN 14 12/07/2021   CREATININE 0.60 12/07/2021   CALCIUM 8.9 12/07/2021   GFRNONAA >60 12/07/2021       Latest Ref Rng & Units 12/07/2021    5:48 PM 12/06/2021   11:45 PM 06/09/2020    1:09 PM  CMP  Glucose 70 - 99 mg/dL 99  12/08/2021  80   BUN 6 - 20 mg/dL 14  15  8    Creatinine 0.44 - 1.00 mg/dL 06/11/2020  914    Sodium 135 - 145 mmol/L 139  136  132   Potassium 3.5 - 5.1 mmol/L 3.8  3.5  4.0   Chloride 98 - 111 mmol/L 107  107  101   CO2 22 - 32 mmol/L 24  24  21    Calcium 8.9 - 10.3 mg/dL 8.9  8.8  9.3   Total Protein 6.5 - 8.1 g/dL  7.0  7.2   Total Bilirubin 0.3 - 1.2 mg/dL  0.5  0.6   Alkaline Phos 38 - 126 U/L  58  39   AST 15 - 41 U/L  18  20   ALT 0 - 44 U/L  30  14  Latest Ref Rng & Units 12/07/2021    5:48 PM 12/06/2021   11:45 PM 01/21/2021    7:29 AM  CBC  WBC 4.0 - 10.5 K/uL 5.8  7.1  9.8   Hemoglobin 12.0 -  15.0 g/dL 75.8  83.2  9.3   Hematocrit 36.0 - 46.0 % 32.3  33.1  27.2   Platelets 150 - 400 K/uL 381  373  255     Lipid Panel No results for input(s): "CHOL", "TRIG", "LDLCALC", "VLDL", "HDL", "CHOLHDL", "LDLDIRECT" in the last 8760 hours.  HEMOGLOBIN A1C No results found for: "HGBA1C", "MPG" TSH No results for input(s): "TSH" in the last 8760 hours.  External labs:     Radiology:    Cardiac Studies:   No results found for this or any previous visit from the past 1095 days.     No results found for this or any previous visit from the past 1095 days.     EKG:   01/15/2022: Sinus Rhythm, normal axis, normal R wave progression. No evidence of ischemia   Assessment     ICD-10-CM   1. Chest pain of uncertain etiology  R07.9 EKG 12-Lead    2. Essential hypertension  I10        Orders Placed This Encounter  Procedures   EKG 12-Lead    No orders of the defined types were placed in this encounter.   Medications Discontinued During This Encounter  Medication Reason   Prenatal Vit-Fe Fumarate-FA (PRENATAL MULTIVITAMIN) TABS Completed Course   sertraline (ZOLOFT) 25 MG tablet    vitamin B-6 (PYRIDOXINE) 25 MG tablet    acetaminophen (TYLENOL) 325 MG tablet    docusate sodium (COLACE) 100 MG capsule Completed Course   ibuprofen (ADVIL) 600 MG tablet Completed Course     Recommendations:   Jessee AURIE HARROUN is a 31 y.o.  female with chest pain   Chest pain of uncertain etiology Chest pain sounds pleuritic in nature, worse with breathing and deep inspiration Patient has history of asthma and has not had the pain recur in a while now Patient's daughter is almost 1 yo now, doubt postpartum CM   Essential hypertension Continue current cardiac medications. Encourage low-sodium diet, less than 2000 mg daily. Schedule imaging tests in office - echocardiogram. Renin/aldo ratio ordered to eval for seconday HTN Follow-up in 3 months or sooner if  needed.   Anasarca Will provide patient with nephrology referral Renal ultrasound ordered Patient took family member's lasix for a few days and lost 5+ lbs in water weight     Clotilde Dieter, DO, [facility]  01/15/2022, 10:45 AM Office: [phone] Pager: [phone]

## 2022-01-27 ENCOUNTER — Emergency Department (HOSPITAL_COMMUNITY): Payer: Medicaid Other

## 2022-01-27 ENCOUNTER — Encounter (HOSPITAL_COMMUNITY): Payer: Self-pay | Admitting: *Deleted

## 2022-01-27 ENCOUNTER — Emergency Department (HOSPITAL_COMMUNITY)
Admission: EM | Admit: 2022-01-27 | Discharge: 2022-01-28 | Disposition: A | Discharge status: Home / Self Care | Payer: Medicaid Other | Attending: Emergency Medicine | Admitting: Emergency Medicine

## 2022-01-27 ENCOUNTER — Other Ambulatory Visit: Payer: Self-pay

## 2022-01-27 ENCOUNTER — Telehealth: Payer: Self-pay

## 2022-01-27 DIAGNOSIS — R601 Generalized edema: Secondary | ICD-10-CM | POA: Insufficient documentation

## 2022-01-27 DIAGNOSIS — Z79899 Other long term (current) drug therapy: Secondary | ICD-10-CM | POA: Diagnosis not present

## 2022-01-27 DIAGNOSIS — I10 Essential (primary) hypertension: Secondary | ICD-10-CM | POA: Insufficient documentation

## 2022-01-27 LAB — COMPREHENSIVE METABOLIC PANEL
ALT: 19 U/L (ref 0–44)
AST: 24 U/L (ref 15–41)
Albumin: 3.4 g/dL — ABNORMAL LOW (ref 3.5–5.0)
Alkaline Phosphatase: 45 U/L (ref 38–126)
Anion gap: 10 (ref 5–15)
BUN: 13 mg/dL (ref 6–20)
CO2: 23 mmol/L (ref 22–32)
Calcium: 9.3 mg/dL (ref 8.9–10.3)
Chloride: 105 mmol/L (ref 98–111)
Creatinine, Ser: 0.77 mg/dL (ref 0.44–1.00)
GFR, Estimated: >60 mL/min (ref >60)
Glucose, Bld: 99 mg/dL (ref 70–99)
Potassium: 3.9 mmol/L (ref 3.5–5.1)
Sodium: 138 mmol/L (ref 135–145)
Total Bilirubin: 0.2 mg/dL — ABNORMAL LOW (ref 0.3–1.2)
Total Protein: 7.3 g/dL (ref 6.5–8.1)

## 2022-01-27 LAB — CBC WITH DIFFERENTIAL/PLATELET
Abs Immature Granulocytes: 0.02 10*3/uL (ref 0.00–0.07)
Basophils Absolute: 0 10*3/uL (ref 0.0–0.1)
Basophils Relative: 0 %
Eosinophils Absolute: 0.3 10*3/uL (ref 0.0–0.5)
Eosinophils Relative: 5 %
HCT: 34.9 % — ABNORMAL LOW (ref 36.0–46.0)
Hemoglobin: 11.9 g/dL — ABNORMAL LOW (ref 12.0–15.0)
Immature Granulocytes: 0 %
Lymphocytes Relative: 47 %
Lymphs Abs: 3.4 10*3/uL (ref 0.7–4.0)
MCH: 30.1 pg (ref 26.0–34.0)
MCHC: 34.1 g/dL (ref 30.0–36.0)
MCV: 88.1 fL (ref 80.0–100.0)
Monocytes Absolute: 0.5 10*3/uL (ref 0.1–1.0)
Monocytes Relative: 6 %
Neutro Abs: 3.1 10*3/uL (ref 1.7–7.7)
Neutrophils Relative %: 42 %
Platelets: 428 10*3/uL — ABNORMAL HIGH (ref 150–400)
RBC: 3.96 MIL/uL (ref 3.87–5.11)
RDW: 13.3 % (ref 11.5–15.5)
WBC: 7.3 10*3/uL (ref 4.0–10.5)
nRBC: 0 % (ref 0.0–0.2)

## 2022-01-27 LAB — ALDOSTERONE + RENIN ACTIVITY W/ RATIO
Aldos/Renin Ratio: 0.9 (ref 0.0–30.0)
Aldosterone: 15.7 ng/dL (ref 0.0–30.0)
Renin Activity, Plasma: 17.191 ng/mL/hr — ABNORMAL HIGH (ref 0.167–5.380)

## 2022-01-27 LAB — URINALYSIS, ROUTINE W REFLEX MICROSCOPIC
Bacteria, UA: NONE SEEN
Bilirubin Urine: NEGATIVE
Glucose, UA: NEGATIVE mg/dL
Ketones, ur: NEGATIVE mg/dL
Leukocytes,Ua: NEGATIVE
Nitrite: NEGATIVE
Protein, ur: NEGATIVE mg/dL
Specific Gravity, Urine: 1.02 (ref 1.005–1.030)
pH: 5 (ref 5.0–8.0)

## 2022-01-27 LAB — I-STAT BETA HCG BLOOD, ED (MC, WL, AP ONLY): I-stat hCG, quantitative: <5 m[IU]/mL (ref <5)

## 2022-01-27 NOTE — Telephone Encounter (Signed)
Labs have not resulted yet, she really really needs to see kidney doctor. I can send more diuretic

## 2022-01-27 NOTE — Telephone Encounter (Signed)
Patient called and is requesting her recent lab results. Also she says that she is still swelling and still having elevated BP's. Last BP was 160/90. She wants you to give her a call back please.

## 2022-01-27 NOTE — Telephone Encounter (Signed)
Was someone talking to this patient?

## 2022-01-27 NOTE — ED Provider Triage Note (Signed)
Emergency Medicine Provider Triage Evaluation Note  Michelle Faulkner , a 31 y.o. female  was evaluated in triage.  Pt complains of fluid retention and hypertension.  She has been evaluated by cardiologist.  She had blood work done and told her renin is very high, but heart labs looked okay.  She states she has swelling to her face, hands, abdomen, and feet.  Also reports headache for the past 2-3 days that is dull.  She states she does not urinate much despite drinking water because she feels thirsty often.  Denies fever, vision changes, chest pain, syncope.   Review of Systems  Positive: See above Negative: See above  Physical Exam  BP (!) 146/117   Pulse (!) 105   Temp 98.2 F (36.8 C) (Oral)   Resp 18   Ht 5\' 3"  (1.6 m)   Wt 101.8 kg   LMP 01/27/2022   SpO2 97%   BMI 39.76 kg/m  Gen:   Awake, no distress   Resp:  Normal effort  MSK:   Moves extremities without difficulty  Other:    Medical Decision Making  Medically screening exam initiated at 8:49 PM.  Appropriate orders placed.  Michelle Faulkner was informed that the remainder of the evaluation will be completed by another provider, this initial triage assessment does not replace that evaluation, and the importance of remaining in the ED until their evaluation is complete.     Cloyde Reams R, Melton Alar 01/27/22 2052

## 2022-01-27 NOTE — ED Triage Notes (Signed)
The pt was on bp med one week ago   swollen all over her body   headache for the past 2-3 days  dull in nature  lmp now

## 2022-01-28 LAB — SODIUM, URINE, RANDOM: Sodium, Ur: 217 mmol/L

## 2022-01-28 LAB — OSMOLALITY, URINE: Osmolality, Ur: 903 mOsm/kg — ABNORMAL HIGH (ref 300–900)

## 2022-01-28 LAB — OSMOLALITY: Osmolality: 291 mOsm/kg (ref 275–295)

## 2022-01-28 LAB — CREATININE, URINE, RANDOM: Creatinine, Urine: 156 mg/dL

## 2022-01-28 LAB — TSH: TSH: 1.9 u[IU]/mL (ref 0.350–4.500)

## 2022-01-28 MED ORDER — POTASSIUM CHLORIDE CRYS ER 20 MEQ PO TBCR: 20.0000 meq | EXTENDED_RELEASE_TABLET | Freq: Every day | ORAL | 0 refills | Status: DC

## 2022-01-28 MED ORDER — FUROSEMIDE 20 MG PO TABS: 20.0000 mg | ORAL_TABLET | Freq: Every day | ORAL | 0 refills | Status: AC

## 2022-01-28 NOTE — Discharge Instructions (Addendum)
You were seen in the ER today with all of your labwork and imaging reassuring at this time. Based on our discussion with nephrology, several labs were ordered to assess renal function but ultimately decision was made for outpatient treatment and follow up.  A prescription for Lasix and potassium have been sent to your pharmacy and you should take these once daily.

## 2022-01-28 NOTE — ED Notes (Signed)
Patient verbalizes understanding of d/c instructions. Opportunities for questions and answers were provided. Pt d/c from ED and ambulated to lobby.  

## 2022-01-28 NOTE — ED Provider Notes (Signed)
[facility] EMERGENCY DEPARTMENT Provider Note   CSN: [id number] Arrival date & time: 01/27/22  1942     History Chief Complaint  Patient presents with   Hypertension    Michelle Faulkner is a 31 y.o. female.   Hypertension Associated symptoms include headaches and shortness of breath. Pertinent negatives include no chest pain and no abdominal pain.  Patient presented to ER due to concerns of hypertension and swelling. She was previously taking olmesartan but has been off this medication for a week now. Patient has been previously seen by cardiology who she reports believes her resistant hypertension may be more due to renal pathology that has not been worked up yet. She reports associated headaches, orthopnea, lower leg swelling, and general swelling throughout body. Denies chest pain, abdominal pain, nausea, vomiting, or dysuria.  Reports that she previously took a dose of Lasix belonging to someone else and was able to lose about 5 pounds of water weight in one day.    Home Medications Prior to Admission medications   Medication Sig Start Date End Date Taking? Authorizing Provider  cholecalciferol (VITAMIN D3) 25 MCG (1000 UNIT) tablet Take 1,000 Units by mouth daily.   Yes [provider]  furosemide (LASIX) 20 MG tablet Take 1 tablet (20 mg total) by mouth daily. 01/28/22 02/27/22 Yes Maryanna Shape A, PA-C  potassium chloride SA (KLOR-CON M) 20 MEQ tablet Take 1 tablet (20 mEq total) by mouth daily for 30 doses. 01/28/22 02/27/22 Yes Smitty Knudsen, PA-C  albuterol (PROVENTIL HFA;VENTOLIN HFA) 108 (90 BASE) MCG/ACT inhaler Inhale 2 puffs into the lungs every 6 (six) hours as needed. For shortness of breath    [provider]  norgestimate-ethinyl estradiol (ORTHO-CYCLEN) 0.25-35 MG-MCG tablet Take 1 tablet by mouth daily. Patient not taking: Reported on 01/27/2022    [provider]  olmesartan (BENICAR) 20 MG tablet Take 1 tablet by  mouth daily. Patient not taking: Reported on 01/27/2022    [provider]      Allergies    Lactose    Review of Systems   Review of Systems  Constitutional:  Positive for fatigue. Negative for chills and fever.  Respiratory:  Positive for shortness of breath.   Cardiovascular:  Positive for leg swelling. Negative for chest pain.  Gastrointestinal:  Negative for abdominal pain, constipation, diarrhea, nausea and vomiting.  Genitourinary:  Positive for decreased urine volume and flank pain. Negative for dysuria.  Neurological:  Positive for headaches. Negative for dizziness.  All other systems reviewed and are negative.   Physical Exam Updated Vital Signs BP 139/87 (BP Location: Right Arm)   Pulse 72   Temp 98.3 F (36.8 C) (Oral)   Resp 18   Ht 5\' 3"  (1.6 m)   Wt 101.8 kg   LMP 01/27/2022   SpO2 99%   BMI 39.76 kg/m  Physical Exam Vitals and nursing note reviewed.  Constitutional:      Appearance: Normal appearance.  HENT:     Head: Normocephalic and atraumatic.  Eyes:     Conjunctiva/sclera: Conjunctivae normal.  Cardiovascular:     Rate and Rhythm: Normal rate and regular rhythm.  Pulmonary:     Effort: Pulmonary effort is normal.     Breath sounds: Normal breath sounds.  Abdominal:     General: Bowel sounds are normal.     Palpations: Abdomen is soft.  Musculoskeletal:     Right lower leg: 1+ Edema present.     Left lower  leg: 1+ Edema present.  Skin:    Capillary Refill: Capillary refill takes less than 2 seconds.  Neurological:     General: No focal deficit present.     Mental Status: She is alert.  Psychiatric:        Mood and Affect: Mood normal.        Behavior: Behavior normal.        Thought Content: Thought content normal.     ED Results / Procedures / Treatments   Labs (all labs ordered are listed, but only abnormal results are displayed) Labs Reviewed  CBC WITH DIFFERENTIAL/PLATELET - Abnormal; Notable for the following  components:      Result Value   Hemoglobin 11.9 (*)    HCT 34.9 (*)    Platelets 428 (*)    All other components within normal limits  COMPREHENSIVE METABOLIC PANEL - Abnormal; Notable for the following components:   Albumin 3.4 (*)    Total Bilirubin 0.2 (*)    All other components within normal limits  URINALYSIS, ROUTINE W REFLEX MICROSCOPIC - Abnormal; Notable for the following components:   Hgb urine dipstick LARGE (*)    All other components within normal limits  TSH  OSMOLALITY, URINE  OSMOLALITY  SODIUM, URINE, RANDOM  CREATININE, URINE, RANDOM  I-STAT BETA HCG BLOOD, ED ([facility], [facility], [facility] ONLY)    EKG None  Radiology DG Chest 2 View  Result Date: 01/27/2022 CLINICAL DATA:  Cough and hypertension EXAM: CHEST - 2 VIEW COMPARISON:  12/06/2021 FINDINGS: The heart size and mediastinal contours are within normal limits. Both lungs are clear. The visualized skeletal structures are unremarkable. IMPRESSION: No active cardiopulmonary disease. Electronically Signed   By: Alcide Clever M.D.   On: 01/27/2022 21:20    Procedures Procedures   Medications Ordered in ED Medications - No data to display  ED Course/ Medical Decision Making/ A&P                           Medical Decision Making Amount and/or Complexity of Data Reviewed Labs: ordered.   This patient presents to the ED for concern of hypertension and diffuse swelling.  Differential diagnosis includes nephritic syndrome, Addison's disease, medication reaction, sickle cell disease, bladder outlet obstruction    Additional history obtained:  External records from outside source obtained and reviewed including aldosterone and renal lab from 01/18/2022   Lab Tests:  I Ordered, and personally interpreted labs.  The pertinent results include: Workup relatively normal with only minimal drop in albumin at 3.4 and blood present on urinalysis but likely secondary to patient being on menstrual period.  Urine sodium, serum  osmolality, urine osmolality, and urine creatinine ordered based on consultation with nephrology who recommended baseline labs.   Imaging Studies ordered:  I ordered imaging studies including chest x-ray I independently visualized and interpreted imaging which showed no evidence of cardiopulmonary disease I agree with the radiologist interpretation   Medicines ordered and prescription drug management:  I have reviewed the patients home medicines and have made adjustments as needed   Problem List / ED Course:  Patient presented to the ER with concerns of hypertension and whole body swelling.  Patient reports that she has been experiencing swelling for approximately 3 months with associated orthopnea and exertional dyspnea.  She reported to me that she had taken a dose of her mother's boyfriend's water pill which resulted in a 5 pound weight loss. Based on consultation with nephrology,  patient condition does not appear to be emergent and patient is safe to discharge home with outpatient follow up with PCP as needed for further evaluation. Discussed these recommendations with patient and she was tearful that more couldn't be done while she was in the ER, but she felt comfortable with plan. Discussed return precautions and patient verbalized understanding.   Final Clinical Impression(s) / ED Diagnoses Final diagnoses:  Hypertension, unspecified type  Generalized edema    Rx / DC Orders ED Discharge Orders          Ordered    furosemide (LASIX) 20 MG tablet  Daily        01/28/22 0654    potassium chloride SA (KLOR-CON M) 20 MEQ tablet  Daily        01/28/22 0654              Smitty Knudsen, PA-C 01/28/22 0710    Sabas Sous, MD 01/29/22 [phone]

## 2022-02-03 ENCOUNTER — Ambulatory Visit: Payer: Medicaid Other

## 2022-02-03 DIAGNOSIS — I10 Essential (primary) hypertension: Secondary | ICD-10-CM

## 2022-02-03 DIAGNOSIS — R079 Chest pain, unspecified: Secondary | ICD-10-CM

## 2022-02-03 DIAGNOSIS — R601 Generalized edema: Secondary | ICD-10-CM

## 2022-02-04 NOTE — Progress Notes (Signed)
Patient aware.

## 2022-02-10 NOTE — Progress Notes (Signed)
Spoke with patient about test results, she acknowledged the information given and had no further questions.

## 2022-02-10 NOTE — Progress Notes (Signed)
normal

## 2022-02-10 NOTE — Progress Notes (Signed)
LM to call back for test results

## 2022-04-19 ENCOUNTER — Ambulatory Visit: Payer: Medicaid Other | Admitting: Internal Medicine

## 2022-04-28 NOTE — Progress Notes (Signed)
Primary Physician/Referring:  Pcp, No  Patient ID: Michelle Faulkner, female    DOB: [birth year], 32 y.o.   MRN: [id number]  No chief complaint on file.  HPI:    Michelle Faulkner  is a 32 y.o. female with past medical history significant for asthma and hypertension and recurrent chest pain.   On previous visit (01/17/2023), she had not had any new chest pain episodes, and symptoms felt to be more pleuritic in nature as the pain intensified upon breathing and deep inspiration. In regards to her HTN, she was instructed to continue her current medications (olmesartan 20 mg PO daily). Pt also presented with anasarca and was started on Lasix 20 MEQ PO daily. She was acceptable for a nephrology referral, and orders were placed for Renal ultrasound, Echocardiogram, and lab work (Renin/aldo ratio ordered to eval for seconday HTN).   Of recent notes, she was seen in the ER on 01/29/2023 for HTN with orthopnea, lower leg swelling, and general swelling throughout body, however, no chest pain, abd pain, N/V, or dysuria . During this visit, she admits to have been off her olmesartan for a week prior. Patient was sent home with orders for Lasix and Postassium with recommendations to follow up with PCP.   Today she presents for follow-up. ***   Past Medical History:  Diagnosis Date   Anemia    Anxiety    Asthma    Depression    Gonorrhea 2007   HSV (herpes simplex virus) infection    Migraine    Vaginal Pap smear, abnormal    Past Surgical History:  Procedure Laterality Date   COLPOSCOPY     WISDOM TOOTH EXTRACTION     Family History  Problem Relation Age of Onset   Heart disease Mother    Asthma Mother    Diabetes Mother    Anxiety disorder Mother    Heart attack Father    Heart disease Father    Ulcers Sister    Heart disease Maternal Grandmother    Atrial fibrillation Maternal Grandmother    Dementia Paternal Grandmother    Cancer Paternal Grandfather    Other Neg Hx     Social  History   Tobacco Use   Smoking status: Never    Passive exposure: Yes   Smokeless tobacco: Never  Substance Use Topics   Alcohol use: Not Currently    Alcohol/week: 1.0 standard drink of alcohol    Types: 1 Shots of liquor per week   Marital Status: Single  ROS  ***  Objective  unknown if currently breastfeeding. There is no height or weight on file to calculate BMI.     01/28/2022    7:16 AM 01/28/2022    5:38 AM 01/27/2022    8:18 PM  Vitals with BMI  Height   '5\' 3"'$   Weight   224 lbs 7 oz  BMI   0000000  Systolic 0000000 [id number]  Diastolic 90 87 [id number]  Pulse 77 72 105    ***  Medications and allergies   Allergies  Allergen Reactions   Lactose Nausea And Vomiting and Other (See Comments)     Medication list after today's encounter   Current Outpatient Medications:    albuterol (PROVENTIL HFA;VENTOLIN HFA) 108 (90 BASE) MCG/ACT inhaler, Inhale 2 puffs into the lungs every 6 (six) hours as needed. For shortness of breath, Disp: , Rfl:    cholecalciferol (VITAMIN D3) 25 MCG (1000 UNIT) tablet, Take 1,000 Units by mouth daily.,  Disp: , Rfl:    furosemide (LASIX) 20 MG tablet, Take 1 tablet (20 mg total) by mouth daily., Disp: 30 tablet, Rfl: 0   norgestimate-ethinyl estradiol (ORTHO-CYCLEN) 0.25-35 MG-MCG tablet, Take 1 tablet by mouth daily. (Patient not taking: Reported on 01/27/2022), Disp: , Rfl:    olmesartan (BENICAR) 20 MG tablet, Take 1 tablet by mouth daily. (Patient not taking: Reported on 01/27/2022), Disp: , Rfl:    potassium chloride SA (KLOR-CON M) 20 MEQ tablet, Take 1 tablet (20 mEq total) by mouth daily for 30 doses., Disp: 30 tablet, Rfl: 0  Laboratory examination:   Lab Results  Component Value Date   NA 138 01/27/2022   K 3.9 01/27/2022   CO2 23 01/27/2022   GLUCOSE 99 01/27/2022   BUN 13 01/27/2022   CREATININE 0.77 01/27/2022   CALCIUM 9.3 01/27/2022   GFRNONAA >60 01/27/2022       Latest Ref Rng & Units 01/27/2022    8:56 PM 12/07/2021     5:48 PM 12/06/2021   11:45 PM  CMP  Glucose 70 - 99 mg/dL 99  99  107   BUN 6 - 20 mg/dL '13  14  15   '$ Creatinine 0.44 - 1.00 mg/dL 0.77  0.60  0.69   Sodium 135 - 145 mmol/L 138  139  136   Potassium 3.5 - 5.1 mmol/L 3.9  3.8  3.5   Chloride 98 - 111 mmol/L 105  107  107   CO2 22 - 32 mmol/L '23  24  24   '$ Calcium 8.9 - 10.3 mg/dL 9.3  8.9  8.8   Total Protein 6.5 - 8.1 g/dL 7.3   7.0   Total Bilirubin 0.3 - 1.2 mg/dL 0.2   0.5   Alkaline Phos 38 - 126 U/L 45   58   AST 15 - 41 U/L 24   18   ALT 0 - 44 U/L 19   30       Latest Ref Rng & Units 01/27/2022    8:56 PM 12/07/2021    5:48 PM 12/06/2021   11:45 PM  CBC  WBC 4.0 - 10.5 K/uL 7.3  5.8  7.1   Hemoglobin 12.0 - 15.0 g/dL 11.9  10.7  11.1   Hematocrit 36.0 - 46.0 % 34.9  32.3  33.1   Platelets 150 - 400 K/uL 428  381  373     Lipid Panel No results for input(s): "CHOL", "TRIG", "LDLCALC", "VLDL", "HDL", "CHOLHDL", "LDLDIRECT" in the last 8760 hours.  HEMOGLOBIN A1C No results found for: "HGBA1C", "MPG" TSH Recent Labs    01/28/22 0632  TSH 1.900   Aldosterone + Renin Activity w/ Ratio     Component Ref Range & Units 3 mo ago  Aldosterone 0.0 - 30.0 ng/dL 15.7  Renin Activity, Plasma 0.167 - 5.380 ng/mL/hr 17.191High   Aldos/Renin Ratio 0.0 - 30.0 0.9    External labs:    Radiology:    Cardiac Studies:   Echocardiogram 02/03/2022:   Normal LV systolic function with visual EF 60-65%. Left ventricle cavity is normal in size. Normal left ventricular wall thickness. Normal global wall motion. Normal diastolic filling pattern, normal LAP. Calculated EF 60%. Structurally normal tricuspid valve.  Mild tricuspid regurgitation. No evidence of pulmonary hypertension. No prior available for comparison.     Renal artery duplex  02/03/2022: No evidence of renal artery occlusive disease in either renal artery. Normal intrarenal vascular perfusion is noted in both kidneys. Renal length is  within normal limits for  both kidneys. Normal abdominal aorta flow velocities noted.    EKG:   01/15/2022: Sinus Rhythm, normal axis, normal R wave progression. No evidence of ischemia   Assessment   No diagnosis found.    No orders of the defined types were placed in this encounter.   No orders of the defined types were placed in this encounter.   There are no discontinued medications.    Recommendations:   Michelle Faulkner is a 32 y.o.  female with chest pain  Michelle Faulkner  is a 32 y.o. female with past medical history significant for asthma and hypertension and recurrent chest pain.   Today she presents for follow up. ***   Essential hypertension ECHO: Normal LV Function, EF 60-65% with Mild tricuspid regurgitation Renal artery duplex: No evidence of renal artery occlusive disease in either renal artery Renin/aldo ratio: 17.191 / 15.7  Plan  - Cardiac meds *** - Encourage low salt diet, Less than 2000 mg daily  2. Anasarca Renal artery duplex: No evidence of renal artery occlusive disease in either renal artery Plan  - Nephro *** - Continue Lasix ***  3. Chest pain of uncertain etiology Plan  *** Chest pain sounds pleuritic in nature, worse with breathing and deep inspiration Patient has history of asthma and has not had the pain recur in a while now Patient's daughter is almost 70 yo now, doubt postpartum CM   Hever Castilleja Donzetta Matters, NP-S   Floydene Flock, DO, [facility]  04/28/2022, 8:59 PM Office: [phone] Pager: [phone]

## 2022-04-29 ENCOUNTER — Encounter: Payer: Self-pay | Admitting: Internal Medicine

## 2022-04-29 ENCOUNTER — Ambulatory Visit: Payer: Medicaid Other | Admitting: Internal Medicine

## 2022-04-29 VITALS — BP 117/82 | HR 80 | Ht 63.0 in | Wt 221.4 lb

## 2022-04-29 DIAGNOSIS — I10 Essential (primary) hypertension: Secondary | ICD-10-CM

## 2022-04-29 DIAGNOSIS — R601 Generalized edema: Secondary | ICD-10-CM

## 2023-04-29 ENCOUNTER — Ambulatory Visit: Payer: Medicaid Other | Attending: Cardiology | Admitting: Cardiology

## 2023-04-29 ENCOUNTER — Ambulatory Visit: Payer: Medicaid Other | Admitting: Internal Medicine
